# Patient Record
Sex: Male | Born: 1970 | Race: Black or African American | Hispanic: No | Marital: Married | Smoking: Never smoker
Health system: Southern US, Community
[De-identification: ages and names within clinical notes are randomized; demographics above are authoritative.]

## PROBLEM LIST (undated history)

## (undated) DIAGNOSIS — Z9989 Dependence on other enabling machines and devices: Secondary | ICD-10-CM

## (undated) DIAGNOSIS — G4730 Sleep apnea, unspecified: Secondary | ICD-10-CM

## (undated) DIAGNOSIS — G4733 Obstructive sleep apnea (adult) (pediatric): Secondary | ICD-10-CM

## (undated) DIAGNOSIS — K219 Gastro-esophageal reflux disease without esophagitis: Secondary | ICD-10-CM

## (undated) DIAGNOSIS — T7840XA Allergy, unspecified, initial encounter: Secondary | ICD-10-CM

## (undated) DIAGNOSIS — K259 Gastric ulcer, unspecified as acute or chronic, without hemorrhage or perforation: Secondary | ICD-10-CM

## (undated) DIAGNOSIS — F419 Anxiety disorder, unspecified: Secondary | ICD-10-CM

## (undated) HISTORY — DX: Dependence on other enabling machines and devices: Z99.89

## (undated) HISTORY — DX: Obstructive sleep apnea (adult) (pediatric): G47.33

## (undated) HISTORY — DX: Allergy, unspecified, initial encounter: T78.40XA

## (undated) HISTORY — DX: Gastric ulcer, unspecified as acute or chronic, without hemorrhage or perforation: K25.9

## (undated) HISTORY — PX: NASAL SINUS SURGERY: SHX719

## (undated) HISTORY — DX: Sleep apnea, unspecified: G47.30

## (undated) HISTORY — DX: Anxiety disorder, unspecified: F41.9

## (undated) HISTORY — DX: Gastro-esophageal reflux disease without esophagitis: K21.9

---

## 2004-12-04 ENCOUNTER — Emergency Department: Payer: Self-pay | Admitting: Emergency Medicine

## 2005-03-29 ENCOUNTER — Ambulatory Visit: Payer: Self-pay | Admitting: Family Medicine

## 2005-05-17 ENCOUNTER — Ambulatory Visit: Payer: Self-pay | Admitting: Family Medicine

## 2005-08-07 ENCOUNTER — Ambulatory Visit: Payer: Self-pay | Admitting: Family Medicine

## 2005-08-14 ENCOUNTER — Ambulatory Visit: Payer: Self-pay | Admitting: Family Medicine

## 2006-09-15 ENCOUNTER — Emergency Department: Payer: Self-pay | Admitting: Emergency Medicine

## 2006-10-02 ENCOUNTER — Ambulatory Visit: Payer: Self-pay | Admitting: Gastroenterology

## 2006-11-18 ENCOUNTER — Emergency Department: Payer: Self-pay | Admitting: Emergency Medicine

## 2007-10-31 ENCOUNTER — Emergency Department: Payer: Self-pay | Admitting: Emergency Medicine

## 2008-03-17 ENCOUNTER — Encounter: Payer: Self-pay | Admitting: Family Medicine

## 2008-05-17 ENCOUNTER — Encounter: Payer: Self-pay | Admitting: Family Medicine

## 2008-10-13 ENCOUNTER — Encounter: Payer: Self-pay | Admitting: Family Medicine

## 2009-12-03 ENCOUNTER — Emergency Department: Payer: Self-pay | Admitting: Emergency Medicine

## 2010-01-20 ENCOUNTER — Encounter: Payer: Self-pay | Admitting: Family Medicine

## 2010-07-25 DIAGNOSIS — K219 Gastro-esophageal reflux disease without esophagitis: Secondary | ICD-10-CM

## 2010-08-09 ENCOUNTER — Ambulatory Visit: Payer: Self-pay | Admitting: Family Medicine

## 2010-08-09 ENCOUNTER — Encounter: Payer: Self-pay | Admitting: Internal Medicine

## 2010-08-09 DIAGNOSIS — G4730 Sleep apnea, unspecified: Secondary | ICD-10-CM | POA: Insufficient documentation

## 2010-08-15 ENCOUNTER — Telehealth: Payer: Self-pay | Admitting: Family Medicine

## 2010-08-17 ENCOUNTER — Ambulatory Visit: Payer: Self-pay | Admitting: Family Medicine

## 2010-08-21 LAB — CONVERTED CEMR LAB
ALT: 30 units/L (ref 0–53)
AST: 24 units/L (ref 0–37)
Albumin: 3.9 g/dL (ref 3.5–5.2)
Alkaline Phosphatase: 45 units/L (ref 39–117)
CO2: 32 meq/L (ref 19–32)
Calcium: 9.7 mg/dL (ref 8.4–10.5)
Creatinine, Ser: 1.1 mg/dL (ref 0.4–1.5)
GFR calc non Af Amer: 95.98 mL/min (ref >60)
Glucose, Bld: 91 mg/dL (ref 70–99)
Total CHOL/HDL Ratio: 4

## 2010-08-28 ENCOUNTER — Encounter: Payer: Self-pay | Admitting: Family Medicine

## 2010-09-04 ENCOUNTER — Ambulatory Visit: Payer: Self-pay | Admitting: Family Medicine

## 2010-09-04 DIAGNOSIS — J45909 Unspecified asthma, uncomplicated: Secondary | ICD-10-CM | POA: Insufficient documentation

## 2010-11-07 ENCOUNTER — Ambulatory Visit: Admit: 2010-11-07 | Payer: Self-pay | Admitting: Internal Medicine

## 2010-11-26 LAB — CONVERTED CEMR LAB
ALT: 18 units/L
ALT: 29 units/L
AST: 22 units/L
BUN: 18 mg/dL
Cholesterol: 148 mg/dL
Creatinine, Ser: 1.07 mg/dL
Glucose, Bld: 90 mg/dL
HDL: 42 mg/dL
Hemoglobin: 15.6 g/dL
LDL Cholesterol: 97 mg/dL
RBC: 4.92 M/uL
RBC: 5.17 M/uL
Sodium: 137 meq/L
TSH: 2.68 microintl units/mL
WBC: 6 10*3/uL

## 2010-11-28 NOTE — Miscellaneous (Signed)
  Clinical Lists Changes  Observations: Added new observation of HCT: 46.3 % (01/20/2010 9:18) Added new observation of HGB: 15.6 g/dL (40/07/2724 3:66) Added new observation of RBC M/UL: 5.17 M/uL (01/20/2010 9:18) Added new observation of WBC COUNT: 6.0 10*3/microliter (01/20/2010 9:18) Added new observation of TSH: 2.68 microintl units/mL (01/20/2010 9:18) Added new observation of SGPT (ALT): 18 units/L (01/20/2010 9:18) Added new observation of SGOT (AST): 20 units/L (01/20/2010 9:18) Added new observation of CREATININE: 1.26 mg/dL (44/12/4740 5:95) Added new observation of BUN: 18 mg/dL (63/87/5643 3:29) Added new observation of BG RANDOM: 81 mg/dL (51/88/4166 0:63) Added new observation of K SERUM: 4.3 meq/L (01/20/2010 9:18) Added new observation of NA: 137 meq/L (01/20/2010 9:18) Added new observation of LDL: 97 mg/dL (01/60/1093 2:35) Added new observation of HDL: 42 mg/dL (57/32/2025 4:27) Added new observation of TRIGLYC TOT: 47 mg/dL (04/21/7627 3:15) Added new observation of CHOLESTEROL: 148 mg/dL (17/61/6073 7:10) Added new observation of HCT: 44.6 % (10/13/2008 9:18) Added new observation of HGB: 14.7 g/dL (62/69/4854 6:27) Added new observation of RBC M/UL: 4.92 M/uL (10/13/2008 9:18) Added new observation of WBC COUNT: 4.8 10*3/microliter (10/13/2008 9:18) Added new observation of TSH: 3.130 microintl units/mL (10/13/2008 9:18) Added new observation of PROTEIN, TOT: 7.5 g/dL (03/50/0938 1:82) Added new observation of SGPT (ALT): 29 units/L (10/13/2008 9:18) Added new observation of SGOT (AST): 22 units/L (10/13/2008 9:18) Added new observation of CREATININE: 1.07 mg/dL (99/37/1696 7:89) Added new observation of BUN: 18 mg/dL (38/07/1750 0:25) Added new observation of BG RANDOM: 90 mg/dL (85/27/7824 2:35) Added new observation of K SERUM: 4.3 meq/L (10/13/2008 9:18) Added new observation of NA: 137 meq/L (10/13/2008 9:18) Added new observation of TSH: 1.478 microintl  units/mL (03/17/2008 9:18) Added new observation of T3 UPTAKE S: 31 % (03/17/2008 9:18) Added new observation of T4, TOTAL: 7.0 mcg/dL (36/14/4315 4:00)

## 2010-11-28 NOTE — Progress Notes (Signed)
Summary: needs order for CPAP mask  Phone Note Call from Patient Call back at [phone]   Caller: Patient Call For: Crawford Givens MD Summary of Call: Pt needs order for new CPAP mask and hose.  He does not want to go to pulmonologist.  He says the settings on his machine are ok, he just needs the new equipment. Initial call taken by: Lowella Petties CMA,  August 15, 2010 11:56 AM  Follow-up for Phone Call        Hard copy written for replacement parts.  Follow-up by: Crawford Givens MD,  August 15, 2010 1:55 PM  Additional Follow-up for Phone Call Additional follow up Details #1::        Patient Advised. Prescription left at front desk.  Additional Follow-up by: Delilah Shan CMA [name]),  August 15, 2010 2:13 PM

## 2010-11-28 NOTE — Assessment & Plan Note (Signed)
Summary: COUGH,CONGESTION,ASTHMA/CLE   Vital Signs:  Patient profile:   40 year old male Height:      71 inches Weight:      220.25 pounds BMI:     30.83 O2 Sat:      95 % on Room air Temp:     98.3 degrees F oral Pulse rate:   80 / minute Pulse rhythm:   regular BP sitting:   112 / 74  (left arm) Cuff size:   large  Vitals Entered By: Delilah Shan CMA [name]) (September 04, 2010 2:19 PM)  O2 Flow:  Room air CC: Cough, congestion, asthma symptoms   History of Present Illness: Head congestion, unable to rest.  Started Saturday, increased over the weekend.  Using his SABA neb.  Out of the MDI.  No fevers.  Cough with sputum.  R ear congested.  Mult sick contacts.  Inc in wheeze.    Usually uses SABA 2x/month.   Able to speak in complete sentences.  O2 sat pre neb.   Allergies: No Known Drug Allergies  Past History:  Past Medical History: GERD (ICD-530.81), h/o esophageal erosions FAMILY HISTORY DIABETES 1ST DEGREE RELATIVE (ICD-V18.0) OSA Asthma  Review of Systems       See HPI.  Otherwise negative.    Physical Exam  General:  GEN: nad, alert and oriented HEENT: mucous membranes moist, L TM w/o erythema, R TM with erythema, nasal epithelium injected, OP with cobblestoning NECK: supple w/o LA CV: rrr. PULM: no inc wob but diffuse ronchi and exp wheeze, improved after neb ABD: soft, +bs EXT: no edema    Impression & Recommendations:  Problem # 1:  OTITIS MEDIA, ACUTE, RIGHT (ICD-382.9) Start zmax and follow up as needed.  See instructions. His updated medication list for this problem includes:    Zithromax 250 Mg Tabs (Azithromycin) .Marland Kitchen... 2 by mouth today and then 1 by mouth once daily for 4 days  Problem # 2:  ACUTE BRONCHITIS (ICD-466.0) Start pred and concurrent antibiotics.  Use SABA as needed and follow up as needed.  Okay for outpatient follow up.  He agrees with plan.  His updated medication list for this problem includes:    Zithromax 250 Mg Tabs  (Azithromycin) .Marland Kitchen... 2 by mouth today and then 1 by mouth once daily for 4 days    Proair Hfa 108 (90 Base) Mcg/act Aers (Albuterol sulfate) .Marland Kitchen... 2 puffs q4h as needed cough/wheeze  Complete Medication List: 1)  Nexium 40 Mg Cpdr (Esomeprazole magnesium) .... Take 1 tablet by mouth once a day 2)  Zithromax 250 Mg Tabs (Azithromycin) .... 2 by mouth today and then 1 by mouth once daily for 4 days 3)  Prednisone 20 Mg Tabs (Prednisone) .... 2 by mouth once daily for 5 days 4)  Proair Hfa 108 (90 Base) Mcg/act Aers (Albuterol sulfate) .... 2 puffs q4h as needed cough/wheeze  Patient Instructions: 1)  I would start the antibiotics today along with the prednisone.  Take the prednisone with food.  Use the inhaler every 4 hours as needed.  Follow up as need.  Take care.  Prescriptions: PROAIR HFA 108 (90 BASE) MCG/ACT AERS (ALBUTEROL SULFATE) 2 puffs q4h as needed cough/wheeze  #1 x 3   Entered and Authorized by:   Crawford Givens MD   Signed by:   Crawford Givens MD on 09/04/2010   Method used:   Electronically to        [id number]  [address]       [place], [place]  [address]       Ph: [phone]       Fax: [phone]   RxID:   [id number] PREDNISONE 20 MG TABS (PREDNISONE) 2 by mouth once daily for 5 days  #10 x 0   Entered and Authorized by:   Crawford Givens MD   Signed by:   Crawford Givens MD on 09/04/2010   Method used:   Electronically to        [id number] ([facility]       [address]       [place], [place]  [address]       Ph: [phone]       Fax: [phone]   RxID:   [id number] ZITHROMAX 250 MG TABS (AZITHROMYCIN) 2 by mouth today and then 1 by mouth once daily for 4 days  #6 x 0   Entered and Authorized by:   Crawford Givens MD   Signed by:   Crawford Givens MD on 09/04/2010   Method used:   Electronically to        [id number]       [address]       [place], [place]  [address]        Ph: [phone]       Fax: [phone]   RxID:   [id number]    Orders Added: 1)  Est. Patient Level III [46270]    Current Allergies (reviewed today): No known allergies

## 2010-11-28 NOTE — Assessment & Plan Note (Signed)
Summary: new patient/alc   Vital Signs:  Patient profile:   40 year old male Height:      71 inches Weight:      219 pounds BMI:     30.65 Temp:     98.3 degrees F oral Pulse rate:   72 / minute Pulse rhythm:   regular BP sitting:   112 / 82  (left arm) Cuff size:   large  Vitals Entered By: Selena Batten Dance CMA [name]) (August 09, 2010 2:54 PM) CC: New patient to establish care   History of Present Illness: GERD- h/o EGD done prev and erosions noted per patient.  Has been on Nexium for 7 years.  Now waking with stomach bloated and pain in back.  Similar to before he started the medicine.  Working to lose weight.  Head of bed not yet elevated.  Has OSA and is on CPAP.  Had sleep study done 2011 near [facility].  Records reviewed today after patient left office (we recieved them after the OV) patient with OSA that needs follow up.  See plan.   Allergies: No Known Drug Allergies  Past History:  Family History: Last updated: 08/09/2010 Family History Diabetes 1st degree relative, Parents, grandparents Family History Hypertension, parents, grandparents Family History of Prostate CA 1st degree relative <50, parents Family History of Stroke F 1st degree relative <60, parents, grandparents Family History of Hearth Disease, parents, grandparents F alive DM, HTN, prostate CA dx'd at 81 M alive, DM, HTN, CAD s/p CABG 4 brothers, 2 with DM, 3 with HTN also 1 sister, healthy  Social History: Last updated: 08/09/2010 Occupation: Art therapist' Production designer, theatre/television/film at [employer] Education:  12 Married 2002 Never Smoked Alcohol use-yes, rare Drug use-no Regular exercise-yes, gym enjoys basketball  Past Medical History: GERD (ICD-530.81), h/o esophageal erosions FAMILY HISTORY DIABETES 1ST DEGREE RELATIVE (ICD-V18.0) OSA  Past Surgical History: Nasal surgery 1990's  Family History: Family History Diabetes 1st degree relative, Parents, grandparents Family History Hypertension, parents,  grandparents Family History of Prostate CA 1st degree relative <50, parents Family History of Stroke F 1st degree relative <60, parents, grandparents Family History of Hearth Disease, parents, grandparents F alive DM, HTN, prostate CA dx'd at 45 M alive, DM, HTN, CAD s/p CABG 4 brothers, 2 with DM, 3 with HTN also 1 sister, healthy  Social History: Occupation: Art therapist' Production designer, theatre/television/film at [employer] Education:  12 Married 2002 Never Smoked Alcohol use-yes, rare Drug use-no Regular exercise-yes, gym enjoys basketball  Review of Systems       See HPI.  Otherwise negative.    Physical Exam  General:  GEN: nad, alert and oriented HEENT: mucous membranes moist NECK: supple w/o LA CV: rrr.  no murmur PULM: ctab, no inc wob ABD: soft, +bs EXT: no edema SKIN: no acute rash    Impression & Recommendations:  Problem # 1:  SLEEP APNEA (ICD-780.57) Recs reviewed.  Refer to pulm for adjustment on settings.  Already has CPAP at home.  Report in the midst of being scanned.  Orders: Pulmonary Referral (Pulmonary)  Problem # 2:  GERD (ICD-530.81) Refer to GI for consideration of repeat EGD considering the continuing symptoms.  No changes in meds today.  Await GI input.  His updated medication list for this problem includes:    Nexium 40 Mg Cpdr (Esomeprazole magnesium) .Marland Kitchen... Take 1 tablet by mouth once a day  Orders: Gastroenterology Referral (GI)  Problem # 3:  FAMILY HISTORY DIABETES 1ST DEGREE RELATIVE (ICD-V18.0) Return for fasting labs.  Problem # 4:  ISCHEMIC HEART DISEASE, FAMILY HX (ICD-V17.3) Return for fasting labs.    Complete Medication List: 1)  Nexium 40 Mg Cpdr (Esomeprazole magnesium) .... Take 1 tablet by mouth once a day   Patient Instructions: 1)  Glad to see you today.  2)  See Shirlee Limerick about your referral before your leave today.   3)  Come back for fasting labs.  cmet/lipid---v18.0 4)  See if you can get the name of the facility where you had your  sleep study done.  Let us know so we can get the records.  5)  Take care.  Glad to see you today.   Current Allergies (reviewed today): No known allergies

## 2010-11-28 NOTE — Letter (Signed)
Summary: New Patient letter  [facility]  [address] [place], [place] [address]   Phone: [phone]  Fax: [phone]       08/09/2010 MRN: [id number]  Garrett Hinton [address] [place], [place]  [address]  Dear Garrett Hinton,  Welcome to the Gastroenterology Division at [facility].    You are scheduled to see Dr.  Marina Goodell on 09-20-10 at 9:30am on the 3rd floor at [facility], [address]  We ask that you try to arrive at our office 15 minutes prior to your appointment time to allow for check-in.  We would like you to complete the enclosed self-administered evaluation form prior to your visit and bring it with you on the day of your appointment.  We will review it with you.  Also, please bring a complete list of all your medications or, if you prefer, bring the medication bottles and we will list them.  Please bring your insurance card so that we may make a copy of it.  If your insurance requires a referral to see a specialist, please bring your referral form from your primary care physician.  Co-payments are due at the time of your visit and may be paid by cash, check or credit card.     Your office visit will consist of a consult with your physician (includes a physical exam), any laboratory testing he/she may order, scheduling of any necessary diagnostic testing (e.g. x-ray, ultrasound, CT-scan), and scheduling of a procedure (e.g. Endoscopy, Colonoscopy) if required.  Please allow enough time on your schedule to allow for any/all of these possibilities.    If you cannot keep your appointment, please call [phone] to cancel or reschedule prior to your appointment date.  This allows [place] the opportunity to schedule an appointment for another patient in need of care.  If you do not cancel or reschedule by 5 p.m. the business day prior to your appointment date, you will be charged a $50.00 late cancellation/no-show fee.    Thank you  for choosing [facility] for your medical needs.  We appreciate the opportunity to care for you.  Please visit [place] at our website  to learn more about our practice.                     Sincerely,                                                             The Gastroenterology Division

## 2010-11-28 NOTE — Procedures (Signed)
Summary: Sleep Study,[facility]  Sleep Study,[facility]   Imported By: Beau Fanny 08/10/2010 09:55:50  _____________________________________________________________________  External Attachment:    Type:   Image     Comment:   External Document

## 2011-01-30 ENCOUNTER — Ambulatory Visit (INDEPENDENT_AMBULATORY_CARE_PROVIDER_SITE_OTHER): Payer: Self-pay | Admitting: Family Medicine

## 2011-01-30 ENCOUNTER — Encounter: Payer: Self-pay | Admitting: Family Medicine

## 2011-01-30 VITALS — BP 116/76 | HR 76 | Temp 98.3°F | Ht 71.0 in | Wt 217.0 lb

## 2011-01-30 DIAGNOSIS — J019 Acute sinusitis, unspecified: Secondary | ICD-10-CM

## 2011-01-30 MED ORDER — AMOXICILLIN 875 MG PO TABS: 875.0000 mg | ORAL_TABLET | Freq: Two times a day (BID) | ORAL | Status: AC

## 2011-01-30 MED ORDER — FLUTICASONE FUROATE 27.5 MCG/SPRAY NA SUSP: 2.0000 | Freq: Every day | NASAL | Status: DC

## 2011-01-30 NOTE — Assessment & Plan Note (Addendum)
Start veramyst and amoxil.  Supportive tx o/w.  Fu prn.  Samples of veramyst given to patient.  Okay for outpatient fu.  He declined work note.

## 2011-01-30 NOTE — Patient Instructions (Signed)
Drink plenty of fluids, take tylenol as needed, and star the amoxil today.  Use the veramyst 2 sprays in each nostril.  This should gradually improve.  Take care.  Let us know if you have other concerns.

## 2011-01-30 NOTE — Progress Notes (Signed)
duration of symptoms: 2 weeks and inc in sx over last few days rhinorrhea:yes congestion:yes ear pain:occ in R ear, feels "clogged" sore throat: no but "itchy" Cough: no Myalgias: yes other concerns: no help with otc allergy meds and mucinex/afrin. HA per patient. No known fevers.  Taking tylenol.  ROS: See HPI.  Otherwise negative.    Meds, vitals, and allergies reviewed.   GEN: nad, alert and oriented HEENT: mucous membranes moist, TM w/o erythema, nasal epithelium injected, OP with cobblestoning, max and frontal sinus ttp.  NECK: supple w/o LA CV: rrr. PULM: ctab, no inc wob ABD: soft, +bs EXT: no edema

## 2011-02-16 ENCOUNTER — Telehealth: Payer: Self-pay | Admitting: *Deleted

## 2011-02-16 MED ORDER — AZITHROMYCIN 250 MG PO TABS: ORAL_TABLET | ORAL | Status: DC

## 2011-02-16 NOTE — Telephone Encounter (Signed)
Pt was seen for sinusitis and given amox and veramyst.  He says the spray never worked, he finished amox last week.  He still has left side face and ear pain, cant fully hear out of ear- feels like he is under water.  [facility].  Should he have more antibiotic or follow up visit?

## 2011-02-16 NOTE — Telephone Encounter (Signed)
I sent in the abx (zithromax).  Have him use that an keep using nasal saline.  If he isn't starting to get better by the end of next week, let us know.  Thanks.

## 2011-02-16 NOTE — Telephone Encounter (Signed)
Patient notified

## 2011-02-22 ENCOUNTER — Telehealth: Payer: Self-pay | Admitting: *Deleted

## 2011-02-22 DIAGNOSIS — Z8249 Family history of ischemic heart disease and other diseases of the circulatory system: Secondary | ICD-10-CM

## 2011-02-22 DIAGNOSIS — Z209 Contact with and (suspected) exposure to unspecified communicable disease: Secondary | ICD-10-CM

## 2011-02-22 NOTE — Telephone Encounter (Signed)
Pt is coming in tomorrow for physical labs and wants to be checked for STD's.  He says his wife has had some problems with pelvic pain and he wants to make sure he doesn't have anything.

## 2011-02-23 ENCOUNTER — Other Ambulatory Visit (INDEPENDENT_AMBULATORY_CARE_PROVIDER_SITE_OTHER): Payer: BC Managed Care – PPO | Admitting: Family Medicine

## 2011-02-23 DIAGNOSIS — Z209 Contact with and (suspected) exposure to unspecified communicable disease: Secondary | ICD-10-CM

## 2011-02-23 DIAGNOSIS — Z8249 Family history of ischemic heart disease and other diseases of the circulatory system: Secondary | ICD-10-CM

## 2011-02-23 LAB — LIPID PANEL
Cholesterol: 156 mg/dL (ref 0–200)
HDL: 36.6 mg/dL — ABNORMAL LOW (ref >39.00)
LDL Cholesterol: 112 mg/dL — ABNORMAL HIGH (ref 0–99)
Triglycerides: 36 mg/dL (ref 0.0–149.0)
VLDL: 7.2 mg/dL (ref 0.0–40.0)

## 2011-02-23 LAB — GLUCOSE, RANDOM: Glucose, Bld: 93 mg/dL (ref 70–99)

## 2011-02-27 ENCOUNTER — Encounter: Payer: BC Managed Care – PPO | Admitting: Family Medicine

## 2011-03-01 ENCOUNTER — Encounter: Payer: Self-pay | Admitting: Family Medicine

## 2011-03-01 ENCOUNTER — Ambulatory Visit (INDEPENDENT_AMBULATORY_CARE_PROVIDER_SITE_OTHER): Payer: BC Managed Care – PPO | Admitting: Family Medicine

## 2011-03-01 DIAGNOSIS — Z0000 Encounter for general adult medical examination without abnormal findings: Secondary | ICD-10-CM

## 2011-03-01 DIAGNOSIS — L910 Hypertrophic scar: Secondary | ICD-10-CM | POA: Insufficient documentation

## 2011-03-01 DIAGNOSIS — G4730 Sleep apnea, unspecified: Secondary | ICD-10-CM

## 2011-03-01 DIAGNOSIS — G4733 Obstructive sleep apnea (adult) (pediatric): Secondary | ICD-10-CM

## 2011-03-01 NOTE — Assessment & Plan Note (Signed)
Wrote rx for DME/mask/hose/humidifier.  He'll call insurance about where to take it.  Will call back as needed, if air leaks persist or if sleep continues to be disrupted.  I would replace the old hardware first and then proceed from there. D/w pt about weight.

## 2011-03-01 NOTE — Assessment & Plan Note (Signed)
Refer

## 2011-03-01 NOTE — Progress Notes (Signed)
CPE- See plan.  Routine anticipatory guidance given to patient.  See health maintenance.  Fatigue-  No FCNAVD. No bruising, bleeding.   OSA and having trouble with air leaks.  Wife notices noisy breathing at night.  Fatigue in AM.  HA in AM.   Keloid on L shoulder- asking about referral.   PMH and SH reviewed  Meds, vitals, and allergies reviewed.   ROS: See HPI.  Otherwise negative.    GEN: nad, alert and oriented HEENT: mucous membranes moist NECK: supple w/o LA CV: rrr. PULM: ctab, no inc wob ABD: soft, +bs EXT: no edema SKIN: no acute rash but keloid noted on L shoulder

## 2011-03-01 NOTE — Assessment & Plan Note (Signed)
Father with prostate cancer at 17.  Aunt with colon cancer, late in life.   Needs screening at 50 for both.  Tdap due 2016. Flu shot d/w pt.  Encouraged for fall vaccine.  D/w pt about labs, diet and exercise.

## 2011-03-01 NOTE — Patient Instructions (Addendum)
See Shirlee Limerick about your referral before your leave today. Keep exercising.  Add up your daily calorie intake on a calorie count website and then cut your total by 5%. Call your insurance about the sleep apnea materials. Take care.

## 2011-05-23 ENCOUNTER — Encounter: Payer: Self-pay | Admitting: Family Medicine

## 2011-07-11 ENCOUNTER — Other Ambulatory Visit: Payer: Self-pay | Admitting: Family Medicine

## 2011-09-18 ENCOUNTER — Encounter: Payer: Self-pay | Admitting: Family Medicine

## 2011-09-18 ENCOUNTER — Ambulatory Visit (INDEPENDENT_AMBULATORY_CARE_PROVIDER_SITE_OTHER): Payer: BC Managed Care – PPO | Admitting: Family Medicine

## 2011-09-18 VITALS — BP 112/70 | HR 91 | Temp 98.5°F | Wt 229.1 lb

## 2011-09-18 DIAGNOSIS — J309 Allergic rhinitis, unspecified: Secondary | ICD-10-CM | POA: Insufficient documentation

## 2011-09-18 DIAGNOSIS — Z23 Encounter for immunization: Secondary | ICD-10-CM

## 2011-09-18 MED ORDER — MOMETASONE FUROATE 50 MCG/ACT NA SUSP: NASAL | Status: DC

## 2011-09-18 MED ORDER — BUDESONIDE-FORMOTEROL FUMARATE 80-4.5 MCG/ACT IN AERO: 1.0000 | INHALATION_SPRAY | Freq: Two times a day (BID) | RESPIRATORY_TRACT | Status: DC

## 2011-09-18 NOTE — Assessment & Plan Note (Signed)
Continue nasonex, but inc to 2 sprays per nostril and f/u prn.  He agrees.  Flu shot done today.

## 2011-09-18 NOTE — Patient Instructions (Signed)
Use the nasonex- 2 sprays in each nostril once a day.  See if that helps.  Keep using nasal saline at night.  Take care.

## 2011-09-18 NOTE — Progress Notes (Deleted)
  Subjective:    Patient ID: Garrett Hinton, male    DOB: [birth year], 40 y.o.   MRN: [id number]  HPI    Review of Systems     Objective:   Physical Exam        Assessment & Plan:

## 2011-09-18 NOTE — Progress Notes (Signed)
B ear popping, post nasal gtt for 2-3 weeks. No fevers.  Scratchy throat.  + facial pressure.  No cough.  Off symbicort in meantime.  No wheeze recently.  Still using nasonex but needs more. Was using 1 spray in each nostril.  Using nasal saline at night.   Meds, vitals, and allergies reviewed.   ROS: See HPI.  Otherwise, noncontributory.  nad ncat Tm w/o erythema but poor movement on valsalva Nasal tissue stuffy with clear discharge OP with cobblestoning Neck supple, no LA rrr ctab

## 2011-10-15 ENCOUNTER — Other Ambulatory Visit: Payer: Self-pay | Admitting: Family Medicine

## 2012-02-22 ENCOUNTER — Encounter: Payer: Self-pay | Admitting: Family Medicine

## 2012-02-22 ENCOUNTER — Ambulatory Visit (INDEPENDENT_AMBULATORY_CARE_PROVIDER_SITE_OTHER): Payer: BC Managed Care – PPO | Admitting: Family Medicine

## 2012-02-22 VITALS — BP 120/84 | HR 82 | Temp 98.0°F | Ht 71.0 in | Wt 218.1 lb

## 2012-02-22 DIAGNOSIS — J309 Allergic rhinitis, unspecified: Secondary | ICD-10-CM

## 2012-02-22 DIAGNOSIS — J0100 Acute maxillary sinusitis, unspecified: Secondary | ICD-10-CM

## 2012-02-22 DIAGNOSIS — H1010 Acute atopic conjunctivitis, unspecified eye: Secondary | ICD-10-CM

## 2012-02-22 MED ORDER — AMOXICILLIN-POT CLAVULANATE 875-125 MG PO TABS: 1.0000 | ORAL_TABLET | Freq: Two times a day (BID) | ORAL | Status: AC

## 2012-02-22 MED ORDER — FLUTICASONE PROPIONATE 50 MCG/ACT NA SUSP: 2.0000 | Freq: Every day | NASAL | Status: DC

## 2012-02-22 NOTE — Patient Instructions (Addendum)
Change to try some Allegra   Genteal Eye Drops: get the severe allergy drops or gel

## 2012-02-22 NOTE — Progress Notes (Signed)
  Patient Name: Garrett Hinton Date of Birth: November 04, 1970 Age: 41 y.o. Medical Record Number: [id number] Gender: male Date of Encounter: 02/22/2012  History of Present Illness:  Garrett Hinton is a 41 y.o. very pleasant male patient who presents with the following:  Sinus pressure --- Left pressure behind left eyes. Cannot breathe for a month. Itchy throat. Ears and eyes are hurting.  Pain behind L > R sinuses, mostly in the maxillary region Bad nasal congestion for a week, stuffy ears, itchy eyes  Taking Zyrtec, Mucinex D, has taken some nasonex, but cannot afford eye drops.  Past Medical History, Surgical History, Social History, Family History, Problem List, Medications, and Allergies have been reviewed and updated if relevant.  Review of Systems: ROS: GEN: Acute illness details above GI: Tolerating PO intake GU: maintaining adequate hydration and urination Pulm: No SOB Interactive and getting along well at home.  Otherwise, ROS is as per the HPI.   Physical Examination: Filed Vitals:   02/22/12 1124  BP: 120/84  Pulse: 82  Temp: 98 F (36.7 C)  TempSrc: Oral  Height: 5\' 11"  (1.803 m)  Weight: 218 lb 1.9 oz (98.939 kg)  SpO2: 98%    Body mass index is 30.42 kg/(m^2).   Gen: WDWN, NAD; alert,appropriate and cooperative throughout exam  HEENT: Normocephalic and atraumatic. Throat clear, w/o exudate, no LAD, R and L TM full with mild fluid. rhinnorhea.  Left frontal and maxillary sinuses: Tender (most tender) Right frontal and maxillary sinuses: Tender  Neck: No ant or post LAD CV: RRR, No M/G/R Pulm: Breathing comfortably in no resp distress. no w/c/r Abd: S,NT,ND,+BS Extr: no c/c/e Psych: full affect, pleasant   Assessment and Plan:  1. Maxillary sinusitis, acute   2. Allergic conjunctivitis and rhinitis    Destabilized AR and AC, leading to sinusitis  Flonase, allegra, genteal  Orders Today: No orders of the defined types were placed in  this encounter.    Medications Today: Meds ordered this encounter  Medications  . fluticasone (FLONASE) 50 MCG/ACT nasal spray    Sig: Place 2 sprays into the nose daily.    Dispense:  16 g    Refill:  12  . amoxicillin-clavulanate (AUGMENTIN) 875-125 MG per tablet    Sig: Take 1 tablet by mouth 2 (two) times daily.    Dispense:  20 tablet    Refill:  0

## 2012-03-10 ENCOUNTER — Telehealth: Payer: Self-pay | Admitting: Family Medicine

## 2012-03-10 NOTE — Telephone Encounter (Signed)
Caller: Garrett Hinton/Patient; PCP: Crawford Givens Clelia Croft); CB#: [phone]; ; ; Call regarding Cough/Congestion;  Pt was seen on 02/22/12 for a sinus infection - given script for Flonase and Augmentin x 10 days. He is still have severe sinus congestion with sinus h/a/frontal lobe /pressure behind his eyes. He is afeb. No improvement on abx. Rn advised f/u appt for re-evaluation/pt refused. He states no money. He also hasn't been seeing the allergest since last year d/t finacial pressures. Dr. Alesia Richards. Pt is asking office to call in a refill of his Albuterol (RN saw order in April but no refills noted?) and if office could change Flonase back to [employer] and call in Symbacort inh QD.  [facility] OFFICE PLEASE CALL PT AND ADVISE. (he only has enough of his Albuterol to last today).  Request sent via [facility] and note sent to MD.   Per Lesli Albee RN 03/10/12 1057    Caller: Garrett Hinton/Patient; PCP: Crawford Givens Clelia Croft); CB#: [phone]; ; ; Call regarding Medication Question; states requested office to consider calling in Albuterol, and changing Flonase back to Nasonex, as he feels the flonase does nothing to iimprove his symptoms, and also to please call in a refill for symbacort inhaler BID.  Declines new triage or appt at this time.  INFO TO OFFICE FOR PROVIDER REVIEW/RX/CALLBACK.  [facility].  MAY REACH PATIENT [phone].

## 2012-03-10 NOTE — Telephone Encounter (Signed)
aller: Garrett Hinton/Patient; PCP: Tabori, Katherine E.; CB#: [phone]; ; ; Call regarding Cuts, Scrapes, Abrasions;  Pt has poison ivy across her jaw /around her mouth and up to her eye. Onset yesterday. Rn traiged and advised appt. No appts available at this point/RN called the back line and then the [place] location. Pt works until 2:30 today. Pt given an appt at the [place] location  for 3:30 today wtih NP O'Sullivan. RN called the number left by the pt to contact 3x and left vm with information.  

## 2012-03-11 ENCOUNTER — Other Ambulatory Visit: Payer: Self-pay | Admitting: *Deleted

## 2012-03-11 MED ORDER — MOMETASONE FUROATE 50 MCG/ACT NA SUSP: 2.0000 | Freq: Every day | NASAL | Status: DC

## 2012-03-11 MED ORDER — BUDESONIDE-FORMOTEROL FUMARATE 80-4.5 MCG/ACT IN AERO: 2.0000 | INHALATION_SPRAY | Freq: Two times a day (BID) | RESPIRATORY_TRACT | Status: AC

## 2012-03-11 MED ORDER — ALBUTEROL SULFATE HFA 108 (90 BASE) MCG/ACT IN AERS: 2.0000 | INHALATION_SPRAY | RESPIRATORY_TRACT | Status: AC | PRN

## 2012-03-11 NOTE — Telephone Encounter (Signed)
Patient advised.

## 2012-03-11 NOTE — Telephone Encounter (Signed)
Ok to refill. plz notify sent all in.  I've changed flonase to nasonex. If asthma not controlled on these meds will need to come in for further eval/changes. Will route to PCP as fyi.

## 2012-03-11 NOTE — Telephone Encounter (Signed)
Please see note from CAN.  Is it okay to refill these Rx's.  Patient says he needs them now.

## 2012-03-17 ENCOUNTER — Encounter: Payer: Self-pay | Admitting: Family Medicine

## 2012-04-21 ENCOUNTER — Telehealth: Payer: Self-pay | Admitting: Family Medicine

## 2012-04-21 NOTE — Telephone Encounter (Signed)
Caller: Mike/Patient; PCP: Sunnie Nielsen); CB#: [phone]; ; ; Call regarding Shoulder Blade Pain;  Pt has an appt scheduled tomorrow at 12:15. he was transferred from the office for a triage. Pt has left shoulder pain at the back that responds to Ibuprofen. Pt is taking 400mg  BID.  Pt did not sustanin an injury. Pt is also having a "stinging sensation" on the right side of the skin around  his lower ribs - if his wife puts her cold hands on it - it feels burning. This is constant. Pt has had it x 2 days. It has stayed the same in severity. Pt feels tired and fatigued.  Pt states if he twists he knows it's there. Pt does take Nexium if he had discomfort like that and it will relieve the discomfort but this isn't. RN advised pt may keep appt tomorrow but call back if sx change or worsen.

## 2012-04-21 NOTE — Telephone Encounter (Signed)
Agreed, will see patient tomorrow.

## 2012-04-22 ENCOUNTER — Encounter: Payer: Self-pay | Admitting: Family Medicine

## 2012-04-22 ENCOUNTER — Ambulatory Visit (INDEPENDENT_AMBULATORY_CARE_PROVIDER_SITE_OTHER): Payer: BC Managed Care – PPO | Admitting: Family Medicine

## 2012-04-22 VITALS — BP 122/80 | HR 74 | Temp 98.6°F | Wt 217.0 lb

## 2012-04-22 DIAGNOSIS — B029 Zoster without complications: Secondary | ICD-10-CM | POA: Insufficient documentation

## 2012-04-22 MED ORDER — VALACYCLOVIR HCL 1 G PO TABS: 1000.0000 mg | ORAL_TABLET | Freq: Three times a day (TID) | ORAL | Status: DC

## 2012-04-22 NOTE — Assessment & Plan Note (Signed)
Typical distribution.  Start valtrex.  Minimal pain.  Will address if it is more of a problem.  Path/phys d/w pt.

## 2012-04-22 NOTE — Patient Instructions (Signed)
Start the valtex today and if you have a lot of pain let me know.

## 2012-04-22 NOTE — Progress Notes (Signed)
Saturday night he had some back pain, near his L shoulder blade and then R > L abdomen pain. The skin on the R side of abd was sensitive to light touch and the area felt hot.  No known rash.  No FCNAVD.  Burning type pain.  The affected area can itch.  Taking ibuprofen with some relief.  Recently with occ HA.  He attributed them to stress ("home, work, everything").  He's using CPAP for OSA with some affect.  Fatigue is noted over the last few days with the symptoms listed above.    Meds, vitals, and allergies reviewed.   ROS: See HPI.  Otherwise, noncontributory.  nad R hemiabdomen with dermatomal change in skin sensation and faint erythema on posterior portion of dermatome.  abd exam wnl o/w

## 2012-04-23 ENCOUNTER — Other Ambulatory Visit: Payer: Self-pay | Admitting: Family Medicine

## 2012-04-24 ENCOUNTER — Telehealth: Payer: Self-pay

## 2012-04-24 NOTE — Telephone Encounter (Signed)
Pt request refill on Nexium. Sherese at [facility] said Nexium rx is ready for pick up. Pt notified by phone. Pt wants to know if any restrictions with shingles and exposure to other people especially children or elderly family members.Please advise.

## 2012-04-24 NOTE — Telephone Encounter (Signed)
Patient advised.

## 2012-04-24 NOTE — Telephone Encounter (Signed)
He shouldn't be around pregnant women or unvaccinated children/people who haven't have chickenpox for the next few days.

## 2012-05-14 ENCOUNTER — Telehealth: Payer: Self-pay

## 2012-05-14 NOTE — Telephone Encounter (Signed)
Pt cannot remember date but within the year pt and pts wife had testing for all STD testing. Pt said he and wife got results back as negative. Pts wife was seen recently by GYN and told had herpes. Pt wonders if 04/22/12 visit diagnosed as shingles might be something else. Please advise. [facility] [address] [facility].

## 2012-05-14 NOTE — Telephone Encounter (Signed)
I think it was unlikely to be herpes. I think it was shingles.  We could draw HSV labs for testing, but if he's ever had a cold sore his test would automatically be positive (so the test might not be useful in this case).

## 2012-05-15 NOTE — Telephone Encounter (Signed)
Patient notified as instructed by telephone. Pt said when had previous testing for all STDs he thought herpes test would have been included. Was herpes test done then? Pt said wife tested last month at GYN and tested positive for herpes. Please advise.

## 2012-05-15 NOTE — Telephone Encounter (Signed)
Patient says he has never had any cold sores, etc and that he and his wife thought everything was going to be tested.  He is a bit disgruntled that this test was not done.

## 2012-05-15 NOTE — Telephone Encounter (Signed)
STD testing was done but not HSV.  It is often not done routinely as any person with a h/o a cold sore would be positive, even if they didn't have a STD.

## 2012-05-15 NOTE — Telephone Encounter (Signed)
If he wants me to check him, he needs an OV so we can discuss the details.

## 2012-05-15 NOTE — Telephone Encounter (Signed)
Patient advised.

## 2012-07-08 ENCOUNTER — Telehealth: Payer: Self-pay

## 2012-07-08 NOTE — Telephone Encounter (Signed)
Prior auth needed for Nexium.called [phone] spoke with Harriett Sine; Nexium 40 mg approved 06/17/12 thru 07/08/13 over the phone. Case ID [id number]. Spoke Denise with [facility] [employer]; rx went thru and Angelique Blonder will notify pt. Approval letter to follow.

## 2012-07-15 ENCOUNTER — Ambulatory Visit (INDEPENDENT_AMBULATORY_CARE_PROVIDER_SITE_OTHER): Payer: BC Managed Care – PPO | Admitting: Family Medicine

## 2012-07-15 ENCOUNTER — Encounter: Payer: Self-pay | Admitting: Family Medicine

## 2012-07-15 VITALS — BP 122/78 | HR 79 | Temp 98.3°F | Wt 226.0 lb

## 2012-07-15 DIAGNOSIS — M7650 Patellar tendinitis, unspecified knee: Secondary | ICD-10-CM

## 2012-07-15 DIAGNOSIS — J329 Chronic sinusitis, unspecified: Secondary | ICD-10-CM

## 2012-07-15 MED ORDER — ESOMEPRAZOLE MAGNESIUM 40 MG PO CPDR: 40.0000 mg | DELAYED_RELEASE_CAPSULE | Freq: Every day | ORAL | Status: DC

## 2012-07-15 MED ORDER — AMOXICILLIN-POT CLAVULANATE 875-125 MG PO TABS: 1.0000 | ORAL_TABLET | Freq: Two times a day (BID) | ORAL | Status: DC

## 2012-07-15 NOTE — Patient Instructions (Addendum)
Take ibuprofen with food and use the exercises for your knee.  Get a jumper's knee brace.  Start the augmentin and use nasal saline.  Take care.

## 2012-07-15 NOTE — Progress Notes (Signed)
Was fine over the summer, but then got sick since going back to school.  Facial pain, dizzy sensation, stuffy, ear pain.  L ear will pop now, but the R ear won't.  Minimal ST recently, predates all the other sx.  No fevers.  He didn't know if a trigger at school was causing this.  Using nasal spray and inhalers.  No wheeze. Rare SABA use.    L knee pain.  Anterior pain.  No injury.  Pain started today.  Doesn't have to wear steel toe boots at work.  L knee with popping on ROM, but doesn't lock.  L knee will be stiff occ.    No R sided knee pain now.    Meds, vitals, and allergies reviewed.   ROS: See HPI.  Otherwise, noncontributory.  GEN: nad, alert and oriented HEENT: mucous membranes moist, tm w/o erythema, nasal exam w/o erythema, clear discharge noted,  OP with cobblestoning, max sinuses ttp NECK: supple w/o LA CV: rrr.   PULM: ctab, no inc wob EXT: no edema SKIN: no acute rash R knee with normal ROM and ACL/MCM/LCL feel solid but pain on the quad ligament, on palpation.  Not puffy, no erythema, no bruising.  Pain on quad ligament improved with circumferential pressure applied to the joint inferior to the patella (to approximate a Jumpers knee brace)

## 2012-07-16 DIAGNOSIS — M7650 Patellar tendinitis, unspecified knee: Secondary | ICD-10-CM | POA: Insufficient documentation

## 2012-07-16 DIAGNOSIS — J329 Chronic sinusitis, unspecified: Secondary | ICD-10-CM | POA: Insufficient documentation

## 2012-07-16 NOTE — Assessment & Plan Note (Signed)
Start augmentin, continue nasal steroids and use more nasal saline.  F/u prn.  He agrees.  If he continues to have trouble seasonally, we may need to get him back to the allergy clinic.

## 2012-07-16 NOTE — Assessment & Plan Note (Signed)
Instructions given re: stretching and strengthening.  Handout given.  Use ibuprofen with GI caution, get OTC Jumpers knee brace and f/u prn.

## 2012-07-17 ENCOUNTER — Telehealth: Payer: Self-pay | Admitting: Family Medicine

## 2012-07-17 DIAGNOSIS — Z209 Contact with and (suspected) exposure to unspecified communicable disease: Secondary | ICD-10-CM

## 2012-07-17 NOTE — Telephone Encounter (Signed)
Caller: Beverly/Patient; Patient Name: Garrett Hinton; PCP: Crawford Givens Clelia Croft) [facility]); Best Callback Phone Number: [phone] Pt was seen earlier this week in office and forgot to mention to the doctor that he would like blood testing for Herpes because his wife was diagnosed with Genital Herpes.  He denies any lesions or a outbreak. Emergent s/s of Herpes diagnosed or suspected protocol r/o. Pt to see provider within 24hrs. Pt would like a lab appointment.

## 2012-07-18 NOTE — Telephone Encounter (Signed)
Labs ordered for lab visit.

## 2012-07-18 NOTE — Telephone Encounter (Signed)
Lab appt scheduled.

## 2012-07-21 ENCOUNTER — Other Ambulatory Visit (INDEPENDENT_AMBULATORY_CARE_PROVIDER_SITE_OTHER): Payer: BC Managed Care – PPO

## 2012-07-21 ENCOUNTER — Telehealth: Payer: Self-pay

## 2012-07-21 DIAGNOSIS — Z209 Contact with and (suspected) exposure to unspecified communicable disease: Secondary | ICD-10-CM

## 2012-07-21 MED ORDER — TRAMADOL HCL 50 MG PO TABS: 50.0000 mg | ORAL_TABLET | Freq: Three times a day (TID) | ORAL | Status: DC | PRN

## 2012-07-21 NOTE — Telephone Encounter (Signed)
I would take up to 600mg  of ibuprofen 3 times a day with food and give the brace a few more days.  If still not improved, then take tramadol for pain.  Sedation caution.  rx sent.

## 2012-07-21 NOTE — Telephone Encounter (Signed)
Pt taking Ibuprofen 400 mg for knee pain; pain relief 1 hr after taking med then pain returns.Pt using knee brace. Pt request [facility] med [facility].Please advise.

## 2012-07-21 NOTE — Telephone Encounter (Signed)
Patient advised.

## 2012-07-22 LAB — HSV(HERPES SIMPLEX VRS) I + II AB-IGM: Herpes Simplex Vrs I&II-IgM Ab (EIA): 1.61 INDEX — ABNORMAL HIGH

## 2012-07-22 LAB — HSV(HERPES SIMPLEX VRS) I + II AB-IGG
HSV 1 Glycoprotein G Ab, IgG: <0.1 IV
HSV 2 Glycoprotein G Ab, IgG: 11.27 IV — ABNORMAL HIGH

## 2012-10-02 ENCOUNTER — Ambulatory Visit (INDEPENDENT_AMBULATORY_CARE_PROVIDER_SITE_OTHER): Payer: BC Managed Care – PPO | Admitting: Family Medicine

## 2012-10-02 ENCOUNTER — Encounter: Payer: Self-pay | Admitting: Family Medicine

## 2012-10-02 VITALS — BP 122/80 | HR 80 | Temp 98.3°F | Wt 231.8 lb

## 2012-10-02 DIAGNOSIS — K219 Gastro-esophageal reflux disease without esophagitis: Secondary | ICD-10-CM

## 2012-10-02 MED ORDER — FLUTICASONE PROPIONATE 50 MCG/ACT NA SUSP: NASAL | Status: AC

## 2012-10-02 NOTE — Patient Instructions (Addendum)
See Shirlee Limerick about your referral before you leave today.   Use the flonase if you can't get the nasonex.  I would get a flu shot each fall, when you are feeling better.

## 2012-10-02 NOTE — Progress Notes (Signed)
Last month of symptoms.  More nosebleeds, HA, fatigued.  H/o sig nasal stuffiness.  He had some nasal sores noted.  He used peroxide on a qtip locally in the nostril. No fevers.  Using CPAP but he needs a new mask.  Had not used nasonex in last 3 weeks. Getting on nasonex had helped prev, with less post nasal gtt.  Using nasal saline.  Flonase didn't work as well as nasonex per patient but he had only used low dose of flonase.   H/o GERD and EGD prev, now with less heartburn but persistent bloating of abd. He had to cancel prev GI referral and needs re-referral.   Meds, vitals, and allergies reviewed.   ROS: See HPI.  Otherwise, noncontributory.  GEN: nad, alert and oriented HEENT: mucous membranes moist, tm w/o erythema, nasal exam w/o erythema but stuffy, clear discharge noted,  OP with cobblestoning NECK: supple w/o LA CV: rrr.   PULM: ctab, no inc wob EXT: no edema SKIN: no acute rash

## 2012-10-03 ENCOUNTER — Encounter: Payer: Self-pay | Admitting: Internal Medicine

## 2012-10-03 NOTE — Assessment & Plan Note (Signed)
Likely exacerbated by being off nasal steroid with inc in nasal irritation.  Would start back on nasonex, if he can't get it then use flonase 2 sprays per nostril per day.  No abscess or nasal cellulitis noted.  F/u prn.  No need for abx based on exam.  F/u prn.

## 2012-10-03 NOTE — Assessment & Plan Note (Signed)
Refer to GI 

## 2012-10-14 ENCOUNTER — Telehealth: Payer: Self-pay | Admitting: Family Medicine

## 2012-10-14 NOTE — Telephone Encounter (Signed)
Patient says he doesn't have the money right now to come for an appt and get medication so he will have to try something different.

## 2012-10-14 NOTE — Telephone Encounter (Signed)
Patient Information:  Caller Name: Zakiah  Phone: [phone]  Patient: Garrett Hinton, Garrett Hinton  Gender: Male  DOB: 06/04/1971  Age: 41 Years  PCP: Crawford Givens Clelia Croft) [facility])  Office Follow Up:  Does the office need to follow up with this patient?: Yes  Instructions For The Office: patient wants antibiotic called in krs/can  RN Note:  Has worsened since being seen in office 10/02/12.  Afebrile.  States has had increased drainage, congestion, and stuffiness with tenderness of cheekbones and forehead.  Per protocol, emergent symptoms denied; advised appt within 24 hours.  Appt offered 10/15/12 but declined; wants Rx for antibiotic called in.  Info to office for provider review/Rx/callback.  [facility]  May reach patient at [phone].  krs/can  Symptoms  Reason For Call & Symptoms: sinus congestion, pain, and greenish nasal drainage  Reviewed Health History In EMR: Yes  Reviewed Medications In EMR: Yes  Reviewed Allergies In EMR: Yes  Reviewed Surgeries / Procedures: Yes  Date of Onset of Symptoms: 09/25/2012  Guideline(s) Used:  Sinus Pain and Congestion  Disposition Per Guideline:   See Today or Tomorrow in Office  Reason For Disposition Reached:   Lots of coughing  Advice Given:  N/A  Patient Refused Recommendation:  Patient Requests Prescription  Wants Rx for antibiotic krs/can

## 2012-10-14 NOTE — Telephone Encounter (Signed)
We need to see him and discuss this.  I don't know for certain he needs an abx.  Thanks.

## 2012-10-15 NOTE — Telephone Encounter (Signed)
Thanks

## 2012-10-15 NOTE — Telephone Encounter (Signed)
All we can offer is OV and have him talk with the front office about the billing.  I don't think it is reasonable to put him on abx blindly.

## 2012-10-15 NOTE — Telephone Encounter (Signed)
Patient notified as instructed by telephone. Was advised by patient that he went to the pharmacy and talked with the pharmacist and got some OTC medications. Patient will call back if he feels that he needs to come in.

## 2012-10-30 ENCOUNTER — Encounter: Payer: Self-pay | Admitting: Internal Medicine

## 2012-11-04 ENCOUNTER — Encounter: Payer: Self-pay | Admitting: Internal Medicine

## 2012-11-04 ENCOUNTER — Ambulatory Visit (INDEPENDENT_AMBULATORY_CARE_PROVIDER_SITE_OTHER): Payer: BC Managed Care – PPO | Admitting: Internal Medicine

## 2012-11-04 VITALS — BP 130/84 | HR 78 | Ht 71.0 in | Wt 232.4 lb

## 2012-11-04 DIAGNOSIS — R194 Change in bowel habit: Secondary | ICD-10-CM

## 2012-11-04 DIAGNOSIS — K219 Gastro-esophageal reflux disease without esophagitis: Secondary | ICD-10-CM

## 2012-11-04 DIAGNOSIS — R198 Other specified symptoms and signs involving the digestive system and abdomen: Secondary | ICD-10-CM

## 2012-11-04 DIAGNOSIS — R1013 Epigastric pain: Secondary | ICD-10-CM

## 2012-11-04 MED ORDER — PANTOPRAZOLE SODIUM 40 MG PO TBEC: 40.0000 mg | DELAYED_RELEASE_TABLET | Freq: Every day | ORAL | Status: DC

## 2012-11-04 MED ORDER — PEG-KCL-NACL-NASULF-NA ASC-C 100 G PO SOLR: 1.0000 | Freq: Once | ORAL | Status: DC

## 2012-11-04 NOTE — Patient Instructions (Addendum)
You have been scheduled for a colonoscopy/Endoscopy with propofol. Please follow written instructions given to you at your visit today.  Please pick up your prep kit at the pharmacy within the next 1-3 days. If you use inhalers (even only as needed) or a CPAP machine, please bring them with you on the day of your procedure.  We have sent the following medications to your pharmacy for you to pick up at your convenience: protonix, take 1 capsul daily, Moviprep; you were given instructions today at your visit.

## 2012-11-05 ENCOUNTER — Encounter: Payer: Self-pay | Admitting: Internal Medicine

## 2012-11-05 NOTE — Progress Notes (Signed)
Patient ID: Garrett Hinton, male   DOB: [birth year], 42 y.o.   MRN: [id number]  SUBJECTIVE: HPI Garrett Hinton is a 42 year old male with a past medical history of asthma, OSA, PUD, and GERD who seen in consultation at the request of Dr. Para March for evaluation of epigastric pain and reflux. The patient is alone today. The patient reports over the last 4-6 months, and possibly longer epigastric abdominal pain. This was most concentrated in the epigastrium just left of midline. It tended to radiate occasionally to the back. It has improved some with the use of Nexium which he was taking daily. Prior to Nexium he was having significant abdominal bloating, but this has improved somewhat. He is currently out of Nexium. He still reports frequent belching and some mild early satiety. He denies dysphagia or diet dysphagia. He notes some nausea but no vomiting. He does report occasional black stools which seem to come and go. He does endorse nosebleeds. He also reports a change in bowel habits with thinner or more narrow stools. There are times when his stools are normal caliber. He denies rectal bleeding. He does report some constipation but denies diarrhea.  He reports a history of prior upper endoscopy where he was told he had a "inflamed lining" and would need a repeat exam in about a year. He has never had a colonoscopy or flexible sigmoidoscopy.  Review of Systems  As per history of present illness, otherwise negative   Past Medical History  Diagnosis Date  . GERD (gastroesophageal reflux disease)   . Asthma   . OSA on CPAP   . Allergy   . Anxiety   . Sleep apnea   . Gastric ulcer     Current Outpatient Prescriptions  Medication Sig Dispense Refill  . albuterol (PROAIR HFA) 108 (90 BASE) MCG/ACT inhaler Inhale 2 puffs into the lungs every 4 (four) hours as needed.  1 Inhaler  11  . budesonide-formoterol (SYMBICORT) 80-4.5 MCG/ACT inhaler Inhale 2 puffs into the lungs 2 (two) times daily.   1 Inhaler  11  . fluticasone (FLONASE) 50 MCG/ACT nasal spray 2 sprays in each nostril per day.  16 g  6  . pantoprazole (PROTONIX) 40 MG tablet Take 1 tablet (40 mg total) by mouth daily.  30 tablet  1  . peg 3350 powder (MOVIPREP) 100 G SOLR Take 1 kit (100 g total) by mouth once.  1 kit  0    No Known Allergies  Family History  Problem Relation Age of Onset  . Diabetes Mother   . Hypertension Mother   . Heart disease Mother     CAD, S/P CABG  . Cancer Father 64    Prostate  . Diabetes Father   . Hypertension Father   . Diabetes Brother   . Hypertension Brother   . Diabetes Brother   . Hypertension Brother   . Hypertension Brother     History  Substance Use Topics  . Smoking status: Never Smoker   . Smokeless tobacco: Never Used  . Alcohol Use: Yes     Comment: Rare    OBJECTIVE: BP 130/84  Pulse 78  Ht 5\' 11"  (1.803 m)  Wt 232 lb 6.4 oz (105.416 kg)  BMI 32.41 kg/m2 Constitutional: Well-developed and well-nourished. No distress. HEENT: Normocephalic and atraumatic. Oropharynx is clear and moist. No oropharyngeal exudate. Conjunctivae are normal. No scleral icterus. Neck: Neck supple. Trachea midline. Cardiovascular: Normal rate, regular rhythm and intact distal pulses. No M/R/G Pulmonary/chest: Effort normal and  breath sounds normal. No wheezing, rales or rhonchi. Abdominal: Soft, mild epigastric tenderness without rebound or guarding, nondistended. Bowel sounds active throughout. There are no masses palpable. No hepatosplenomegaly. Extremities: no clubbing, cyanosis, or edema Lymphadenopathy: No cervical adenopathy noted. Neurological: Alert and oriented to person place and time. Skin: Skin is warm and dry. No rashes noted. Psychiatric: Normal mood and affect. Behavior is normal.   ASSESSMENT AND PLAN: 42 year old male with a past medical history of asthma, OSA, PUD, and GERD who seen in consultation at the request of Dr. Para March for evaluation of epigastric  pain and reflux, also reporting change in bowel habits  1.  Epigastric pain/GERD/early satiety -- given the patient's constellation of symptoms which have persisted somewhat despite daily PPI, I recommended upper endoscopy. We discussed the test and he is agreeable to proceed. I would like to obtain his prior records from Dr. Lynnae Prude, especially his prior EGDs any pathology from that test.  In the meantime I will change his Nexium to pantoprazole 40 mg daily. This changes made to try to make the medication more affordable given that this is generic.  He does describe what sounds like melena, but this may be secondary to epistaxis. EGD will help to evaluate for PUD.  2.  Change in bowel habit -- given the patient's change in bowel habits and narrow caliber stools, I recommended direct visualization with colonoscopy. We discussed this test and he is agreeable to proceed. We can perform upper endoscopy and colonoscopy on the same day.  Further recommendations after procedure

## 2012-11-10 ENCOUNTER — Telehealth: Payer: Self-pay | Admitting: *Deleted

## 2012-11-10 MED ORDER — NA SULFATE-K SULFATE-MG SULF 17.5-3.13-1.6 GM/177ML PO SOLN: 1.0000 | Freq: Once | ORAL | Status: DC

## 2012-11-10 NOTE — Telephone Encounter (Signed)
Pt informed Janell he can't afford the MOVI PREP. May I give him SUPREP Samples? Thanks.

## 2012-11-10 NOTE — Telephone Encounter (Signed)
Yes, okay for Suprep samples.  Instruction sheet will need to be changed with change in prep

## 2012-11-10 NOTE — Telephone Encounter (Signed)
Called pt. Told him I was sending in Suprep for him since moviprep was so expensive.  I also told him I will be mailing out new instructions and to call me when he receives them and we can go over them. Pt stated understanding

## 2012-12-01 ENCOUNTER — Telehealth: Payer: Self-pay | Admitting: Internal Medicine

## 2012-12-01 NOTE — Telephone Encounter (Signed)
Spoke to pt.  Told him I can leave a moviprep voucher for him at our front desk for a free prep.  Pt was very appreciative and said he will pick it up tomorrow.

## 2012-12-03 ENCOUNTER — Ambulatory Visit (AMBULATORY_SURGERY_CENTER): Payer: BC Managed Care – PPO | Admitting: Internal Medicine

## 2012-12-03 ENCOUNTER — Encounter: Payer: Self-pay | Admitting: Internal Medicine

## 2012-12-03 ENCOUNTER — Other Ambulatory Visit: Payer: Self-pay | Admitting: Internal Medicine

## 2012-12-03 VITALS — BP 123/81 | HR 73 | Temp 97.8°F | Resp 24 | Ht 71.0 in | Wt 232.0 lb

## 2012-12-03 DIAGNOSIS — D131 Benign neoplasm of stomach: Secondary | ICD-10-CM

## 2012-12-03 DIAGNOSIS — D126 Benign neoplasm of colon, unspecified: Secondary | ICD-10-CM

## 2012-12-03 DIAGNOSIS — K209 Esophagitis, unspecified: Secondary | ICD-10-CM

## 2012-12-03 DIAGNOSIS — R1013 Epigastric pain: Secondary | ICD-10-CM

## 2012-12-03 DIAGNOSIS — K635 Polyp of colon: Secondary | ICD-10-CM

## 2012-12-03 DIAGNOSIS — R198 Other specified symptoms and signs involving the digestive system and abdomen: Secondary | ICD-10-CM

## 2012-12-03 DIAGNOSIS — R194 Change in bowel habit: Secondary | ICD-10-CM

## 2012-12-03 DIAGNOSIS — K219 Gastro-esophageal reflux disease without esophagitis: Secondary | ICD-10-CM

## 2012-12-03 MED ORDER — ESOMEPRAZOLE MAGNESIUM 40 MG PO CPDR: 40.0000 mg | DELAYED_RELEASE_CAPSULE | Freq: Every day | ORAL | Status: AC

## 2012-12-03 MED ORDER — SODIUM CHLORIDE 0.9 % IV SOLN: 500.0000 mL | INTRAVENOUS | Status: DC

## 2012-12-03 NOTE — Op Note (Signed)
[facility] [address] [place], [address]   COLONOSCOPY PROCEDURE REPORT  PATIENT: [place], Pettengill  MR#: [id number] BIRTHDATE: 01/16/1971 , 42  yrs. old GENDER: Male ENDOSCOPIST: Beverley Fiedler, MD REFERRED XB:JYNWGN Duncan, M.D. PROCEDURE DATE:  12/03/2012 PROCEDURE:   Colonoscopy with snare polypectomy and Colonoscopy with cold biopsy polypectomy ASA CLASS:   Class II INDICATIONS:Change in bowel habits. MEDICATIONS: MAC sedation, administered by CRNA and Propofol (Diprivan) 140 mg IV  DESCRIPTION OF PROCEDURE:   After the risks benefits and alternatives of the procedure were thoroughly explained, informed consent was obtained.  A digital rectal exam revealed no rectal mass.   The LB [id number]  endoscope was introduced through the anus and advanced to the terminal ileum which was intubated for a short distance. No adverse events experienced.   The quality of the prep was good, using MoviPrep  The instrument was then slowly withdrawn as the colon was fully examined.   COLON FINDINGS: The mucosa appeared normal in the terminal ileum. A sessile polyp measuring 3 mm in size was found at the cecum.  A polypectomy was performed with cold forceps.  The resection was complete and the polyp tissue was completely retrieved.   Six small sessile polyps, ranging between 3-63mm in size, were found in the rectosigmoid colon.  Polypectomy was performed using cold snare. All resections were complete and all polyp tissue was completely retrieved.   Small internal hemorrhoids were found.  Retroflexed views revealed internal hemorrhoids. The time to cecum=1 minutes 03 seconds.  Withdrawal time=15 minutes 27 seconds.  The scope was withdrawn and the procedure completed. COMPLICATIONS: There were no complications.  ENDOSCOPIC IMPRESSION: 1.   Normal mucosa in the terminal ileum 2.   Sessile polyp measuring 3 mm in size was found at the cecum; polypectomy was  performed with cold forceps 3.   Six small sessile polyps, ranging between 3-29mm in size, were found in the rectosigmoid colon; Polypectomy was performed using cold snare 4.   Small internal hemorrhoids  RECOMMENDATIONS: 1.  Await pathology results 2.  Timing of repeat colonoscopy will be determined by pathology findings. 3.  You will receive a letter within 1-2 weeks with the results of your biopsy as well as final recommendations.  Please call my office if you have not received a letter after 3 weeks.   eSigned:  Beverley Fiedler, MD 12/03/2012 3:01 PM cc: Crawford Givens, MD and The Patient

## 2012-12-03 NOTE — Progress Notes (Signed)
Patient did not experience any of the following events: a burn prior to discharge; a fall within the facility; wrong site/side/patient/procedure/implant event; or a hospital transfer or hospital admission upon discharge from the facility. (G8907) Patient did not have preoperative order for IV antibiotic SSI prophylaxis. (G8918)  

## 2012-12-03 NOTE — Progress Notes (Signed)
1459 a/ox3 pleased with Mac report to [employer]

## 2012-12-03 NOTE — Op Note (Signed)
[facility] [address] [place], [address]   ENDOSCOPY PROCEDURE REPORT  PATIENT: Garrett Hinton, Garrett Hinton  MR#: [id number] BIRTHDATE: 1971-09-23 , 42  yrs. old GENDER: Male ENDOSCOPIST: Beverley Fiedler, MD REFERRED BY:  Crawford Givens PROCEDURE DATE:  12/03/2012 PROCEDURE:  EGD w/ biopsy ASA CLASS:     Class II INDICATIONS:  Epigastric pain.   Heartburn. MEDICATIONS: MAC sedation, administered by CRNA, Propofol (Diprivan), Propofol (Diprivan) 310 mg IV, and Robinul 0.2 mg IV TOPICAL ANESTHETIC: none  DESCRIPTION OF PROCEDURE: After the risks benefits and alternatives of the procedure were thoroughly explained, informed consent was obtained.  [facility] [id number] [id number] endoscope was introduced through the mouth and advanced to the second portion of the duodenum. Without limitations.  The instrument was slowly withdrawn as the mucosa was fully examined.     ESOPHAGUS: A non-obstructing and partial Schatzki ring was found 39 cm from the incisors and was widely open.  Multiple biopsies were performed.  Sample sent for histology.   The esophagus was otherwise normal.   A small hiatal hernia was noted.  STOMACH: There was mild antral gastropathy noted.  Cold forcep biopsies were taken at the antrum and angularis.   The stomach otherwise appeared normal.  DUODENUM: The duodenal mucosa showed no abnormalities in the bulb and second portion of the duodenum.  Retroflexed views revealed a hiatal hernia.     The scope was then withdrawn from the patient and the procedure completed.  COMPLICATIONS: There were no complications.  ENDOSCOPIC IMPRESSION: 1.   Non-obstructing Schatzki's ring was found 39 cm from the incisors; multiple biopsies 2.   The esophagus was otherwise normal. 3.   Small hiatal hernia 4.   There was mild antral gastropathy noted; biopsies taken to exclude H. pylori 5.   The stomach otherwise appeared normal 6.   The duodenal mucosa showed no  abnormalities in the bulb and second portion of the duodenum  RECOMMENDATIONS: 1.  Await pathology results 2.  Continue current medications 3.  Follow-up of helicobacter pylori status, treat if indicated  eSigned:  Beverley Fiedler, MD 12/03/2012 2:57 PM   CC:The Patient and Crawford Givens, MD

## 2012-12-03 NOTE — Patient Instructions (Addendum)

## 2012-12-04 ENCOUNTER — Telehealth: Payer: Self-pay | Admitting: *Deleted

## 2012-12-04 ENCOUNTER — Telehealth: Payer: Self-pay | Admitting: Family Medicine

## 2012-12-04 MED ORDER — VALACYCLOVIR HCL 500 MG PO TABS: 500.0000 mg | ORAL_TABLET | Freq: Two times a day (BID) | ORAL | Status: AC

## 2012-12-04 NOTE — Telephone Encounter (Signed)
Patient Information:  Caller Name: Moroni  Phone: [phone]  Patient: Garrett Hinton, Garrett Hinton  Gender: Male  DOB: 03-Jun-1971  Age: 42 Years  PCP: Crawford Givens Clelia Croft) [facility])  Office Follow Up:  Does the office need to follow up with this patient?: Yes  Instructions For The Office: Please review information, Wanting Rx called in for Herpes.  RN Note:  Just had colonoscopy and endoscopy exams yesterday 2/5, says unable to come to office because of cost.  Just wanting Rx called into [facility] in [place] or [facility] in [place].  Symptoms  Reason For Call & Symptoms: Tingling, sensitive, swelling, hot in genital area started last night - thinks these symptoms are prior to herpes outbreak in area. Was seen in office in 06/2012, told to call office if outbreak and medication can be called in since herpes test was positive.  Reviewed Health History In EMR: Yes  Reviewed Medications In EMR: Yes  Reviewed Allergies In EMR: Yes  Reviewed Surgeries / Procedures: Yes  Date of Onset of Symptoms: 12/03/2012  Guideline(s) Used:  Rash or Redness - Localized  Penis and Scrotum Symptoms  Disposition Per Guideline:   Go to Office Now  Reason For Disposition Reached:   Looks infected (e.g., draining sore, spreading redness)  Advice Given:  N/A  Patient Refused Recommendation:  Patient Requests Prescription  Seen in office in Sept 2013 and had labs with positive herpes results.  Told to call ofice if outbreak and Rx could be called in.

## 2012-12-04 NOTE — Telephone Encounter (Signed)
  Follow up Call-  Call back number 12/03/2012  Post procedure Call Back phone  # [phone]  Permission to leave phone message Yes     Patient questions:  Do you have a fever, pain , or abdominal swelling? no Pain Score  0 *  Have you tolerated food without any problems? yes  Have you been able to return to your normal activities? yes  Do you have any questions about your discharge instructions: Diet   no Medications  no Follow up visit  no  Do you have questions or concerns about your Care? no  Actions: * If pain score is 4 or above: No action needed, pain <4.   Pt. Stated that he had not had BM since procedure yesterday.  He has been eating and drinking as advised yesterday and tolerates fine.  Encouraged to resume Normal diet with liberal fluid intake today.  I told him that he may not have BM for a couple of days but if this becomes problematic to call office.  He verbalized understanding.

## 2012-12-04 NOTE — Telephone Encounter (Signed)
Patient advised.

## 2012-12-04 NOTE — Telephone Encounter (Signed)
Sent. Thanks.   

## 2012-12-09 ENCOUNTER — Encounter: Payer: Self-pay | Admitting: Internal Medicine

## 2013-05-12 ENCOUNTER — Emergency Department: Payer: Self-pay | Admitting: Emergency Medicine

## 2013-05-12 LAB — CBC
HCT: 42.7 % (ref 40.0–52.0)
HGB: 14.2 g/dL (ref 13.0–18.0)
MCH: 29.1 pg (ref 26.0–34.0)
MCHC: 33.2 g/dL (ref 32.0–36.0)
RDW: 14.4 % (ref 11.5–14.5)
WBC: 7 10*3/uL (ref 3.8–10.6)

## 2013-05-12 LAB — CK TOTAL AND CKMB (NOT AT ARMC)
CK, Total: 439 U/L — ABNORMAL HIGH (ref 35–232)
CK-MB: 2.9 ng/mL (ref 0.5–3.6)

## 2013-05-12 LAB — BASIC METABOLIC PANEL
Anion Gap: 4 — ABNORMAL LOW (ref 7–16)
BUN: 17 mg/dL (ref 7–18)
Chloride: 108 mmol/L — ABNORMAL HIGH (ref 98–107)
Co2: 28 mmol/L (ref 21–32)
Creatinine: 1.18 mg/dL (ref 0.60–1.30)
EGFR (African American): >60
Glucose: 121 mg/dL — ABNORMAL HIGH (ref 65–99)
Osmolality: 282 (ref 275–301)
Potassium: 3.8 mmol/L (ref 3.5–5.1)

## 2013-05-12 LAB — TROPONIN I: Troponin-I: <0.02 ng/mL

## 2013-05-13 LAB — TROPONIN I: Troponin-I: <0.02 ng/mL

## 2013-09-08 ENCOUNTER — Ambulatory Visit: Payer: Self-pay | Admitting: Family Medicine

## 2014-08-05 ENCOUNTER — Emergency Department: Payer: Self-pay | Admitting: Emergency Medicine

## 2014-08-05 LAB — CBC WITH DIFFERENTIAL/PLATELET
Basophil #: 0.1 10*3/uL (ref 0.0–0.1)
Basophil %: 1.6 %
Eosinophil #: 0.1 10*3/uL (ref 0.0–0.7)
Eosinophil %: 1.8 %
HCT: 47.4 % (ref 40.0–52.0)
HGB: 15.3 g/dL (ref 13.0–18.0)
LYMPHS ABS: 2.9 10*3/uL (ref 1.0–3.6)
Lymphocyte %: 42.6 %
MCH: 28.6 pg (ref 26.0–34.0)
MCHC: 32.2 g/dL (ref 32.0–36.0)
MCV: 89 fL (ref 80–100)
Monocyte #: 0.6 x10 3/mm (ref 0.2–1.0)
Monocyte %: 8 %
NEUTROS PCT: 46 %
Neutrophil #: 3.2 10*3/uL (ref 1.4–6.5)
Platelet: 339 10*3/uL (ref 150–440)
RBC: 5.34 10*6/uL (ref 4.40–5.90)
RDW: 14.5 % (ref 11.5–14.5)
WBC: 6.9 10*3/uL (ref 3.8–10.6)

## 2014-08-05 LAB — URINALYSIS, COMPLETE
Bacteria: NONE SEEN
Bilirubin,UR: NEGATIVE
Blood: NEGATIVE
Glucose,UR: NEGATIVE mg/dL (ref 0–75)
Hyaline Cast: 5
Ketone: NEGATIVE
Leukocyte Esterase: NEGATIVE
Nitrite: NEGATIVE
Ph: 6 (ref 4.5–8.0)
Protein: NEGATIVE
Specific Gravity: 1.024 (ref 1.003–1.030)
Squamous Epithelial: NONE SEEN
WBC UR: 3 /HPF (ref 0–5)

## 2014-08-05 LAB — BASIC METABOLIC PANEL
Anion Gap: 8 (ref 7–16)
BUN: 20 mg/dL — ABNORMAL HIGH (ref 7–18)
CO2: 26 mmol/L (ref 21–32)
CREATININE: 1.02 mg/dL (ref 0.60–1.30)
Calcium, Total: 8.9 mg/dL (ref 8.5–10.1)
Chloride: 106 mmol/L (ref 98–107)
EGFR (African American): >60
EGFR (Non-African Amer.): >60
Glucose: 100 mg/dL — ABNORMAL HIGH (ref 65–99)
OSMOLALITY: 282 (ref 275–301)
POTASSIUM: 5.3 mmol/L — AB (ref 3.5–5.1)
Sodium: 140 mmol/L (ref 136–145)

## 2014-08-05 LAB — TROPONIN I: Troponin-I: <0.02 ng/mL

## 2014-08-06 LAB — CK TOTAL AND CKMB (NOT AT ARMC)
CK, Total: 362 U/L — ABNORMAL HIGH
CK-MB: 3.5 ng/mL (ref 0.5–3.6)

## 2014-08-06 LAB — TROPONIN I: Troponin-I: <0.02 ng/mL

## 2014-09-06 ENCOUNTER — Ambulatory Visit: Payer: Self-pay | Admitting: Neurology

## 2015-08-22 IMAGING — CT CT ABD-PELV W/ CM
2 of 5 series · 17 of 46 positions shown, 19 images · IV contrast (isovue)
Comparison: Abdominal ultrasound 05/13/2013

CLINICAL DATA: Right upper quadrant abdominal pain. Initial
encounter.

EXAM:
CT ABDOMEN AND PELVIS WITH CONTRAST
TECHNIQUE: Multidetector CT imaging of the abdomen and pelvis was performed
using the standard protocol following bolus administration of
intravenous contrast.
CONTRAST:  100 mL Isovue 370

[Series 2: routine abd pel with · axial · 0.73mm/px · z∈[-610,-194]mm · 14 of 95 slices shown, 16 images]
[im 6/95  soft-tissue]
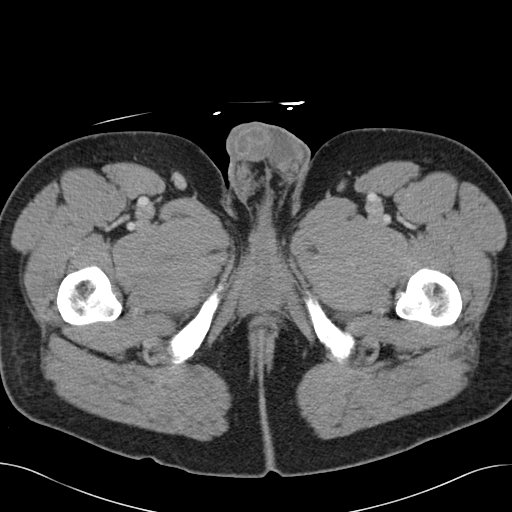
[im 6/95  bone]
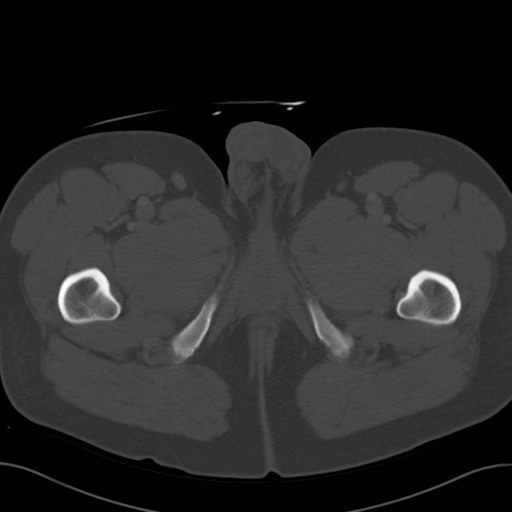
[im 11/95  soft-tissue]
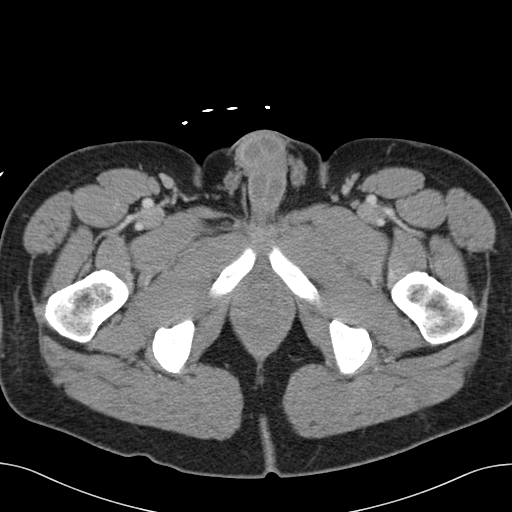
[im 21/95  soft-tissue]
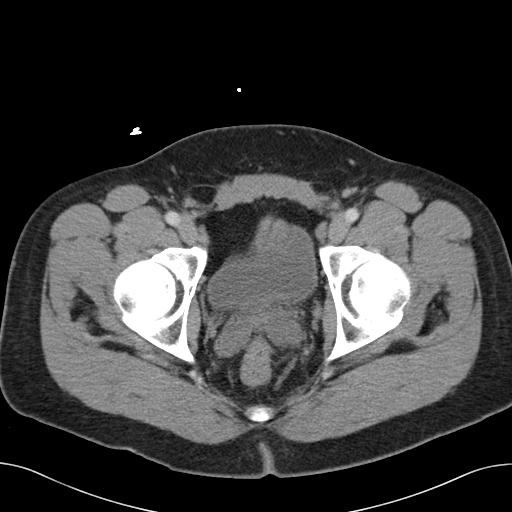
[im 27/95  soft-tissue]
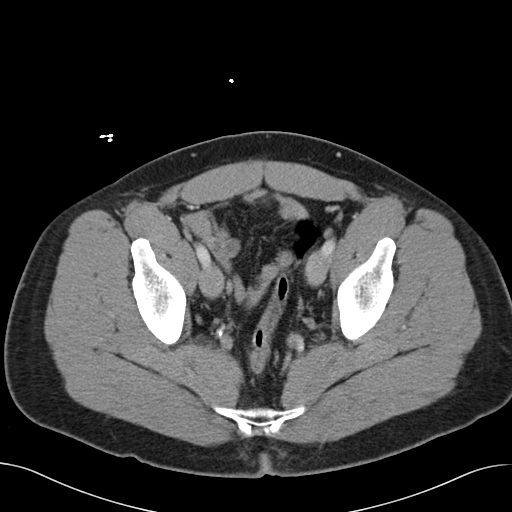
[im 32/95  soft-tissue]
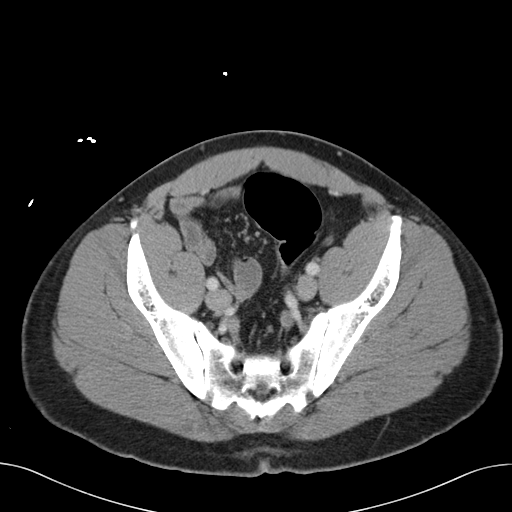
[im 37/95  soft-tissue]
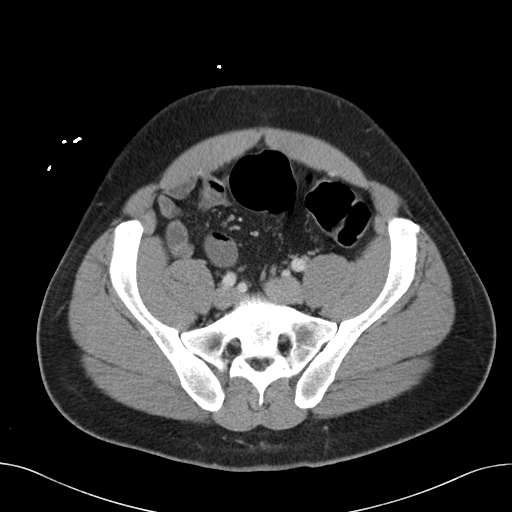
[im 42/95  soft-tissue]
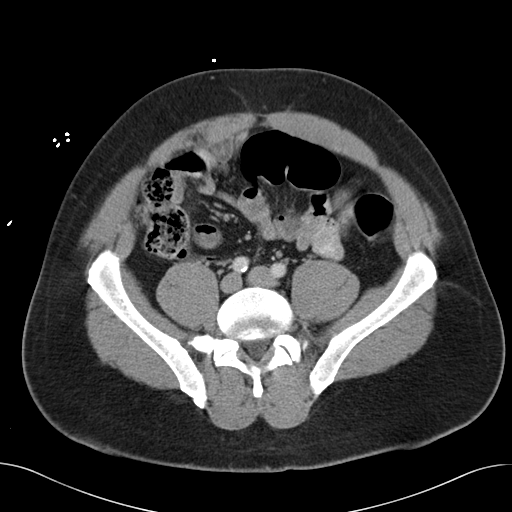
[im 53/95  soft-tissue]
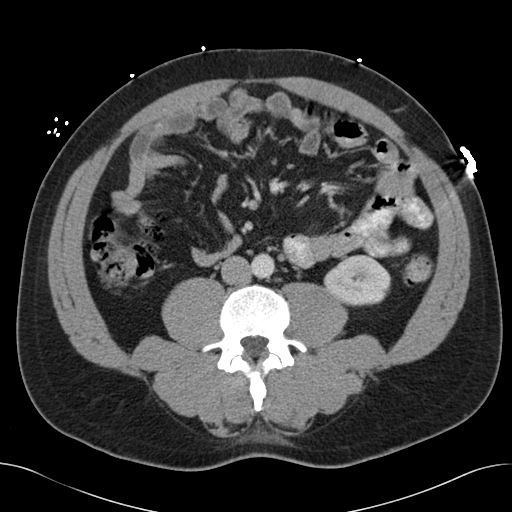
[im 58/95  soft-tissue]
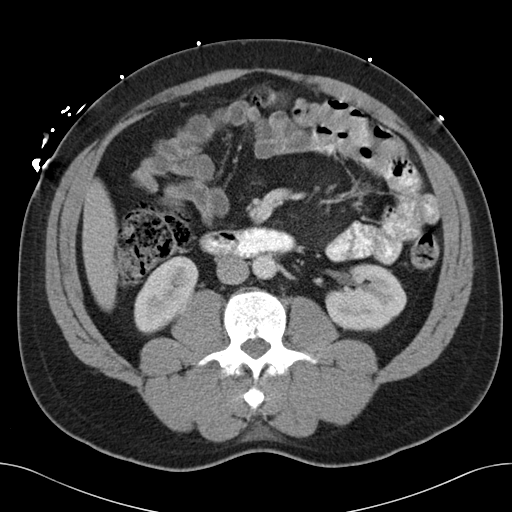
[im 58/95  bone]
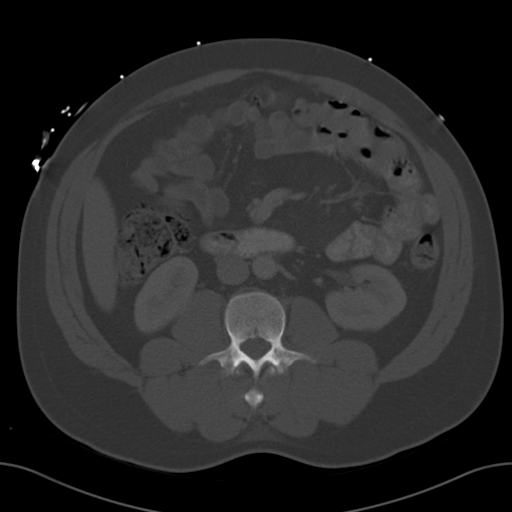
[im 63/95  soft-tissue]
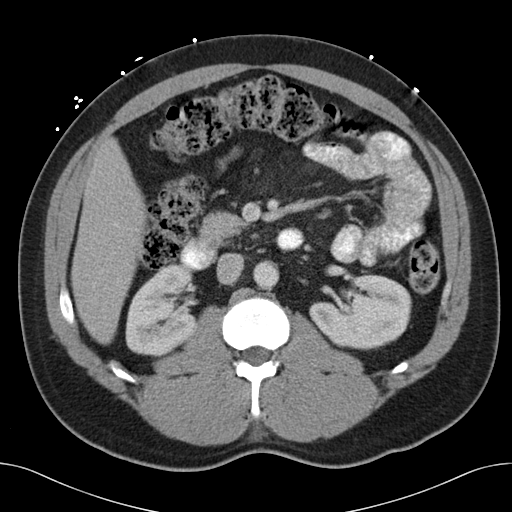
[im 68/95  soft-tissue]
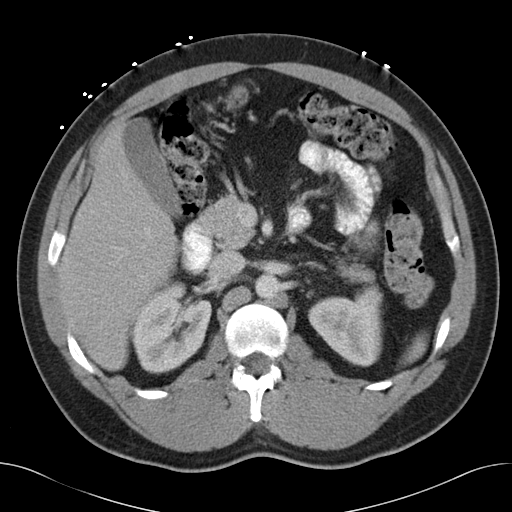
[im 74/95  soft-tissue]
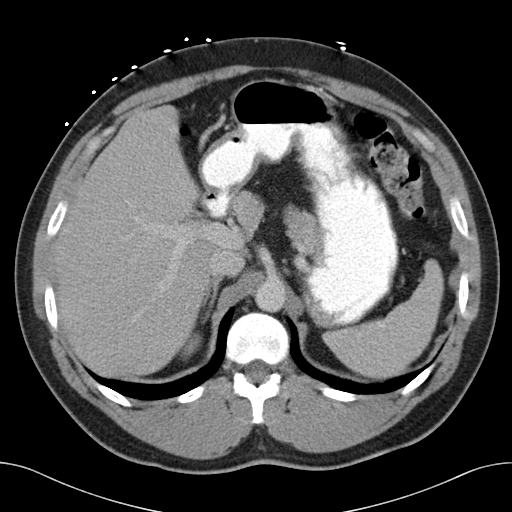
[im 84/95  soft-tissue]
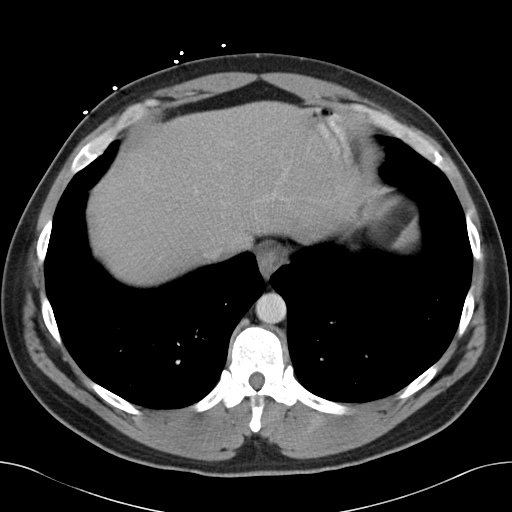
[im 89/95  soft-tissue]
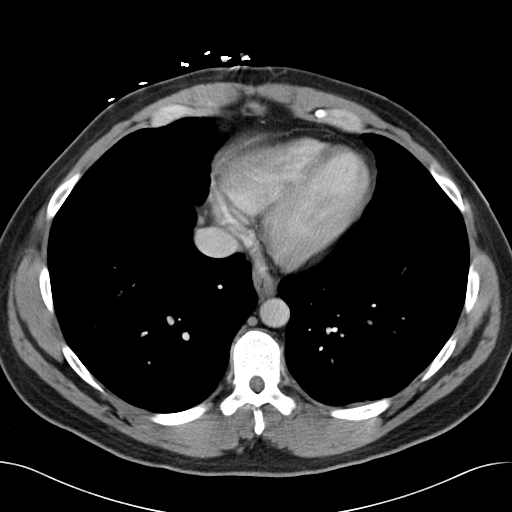

[Series 5: cor routine abd pel with · coronal · 0.93mm/px · 3 of 160 slices shown]
[im 54/160  soft-tissue]
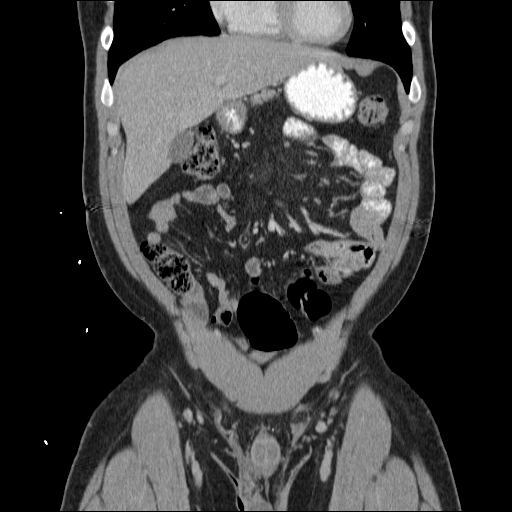
[im 71/160  soft-tissue]
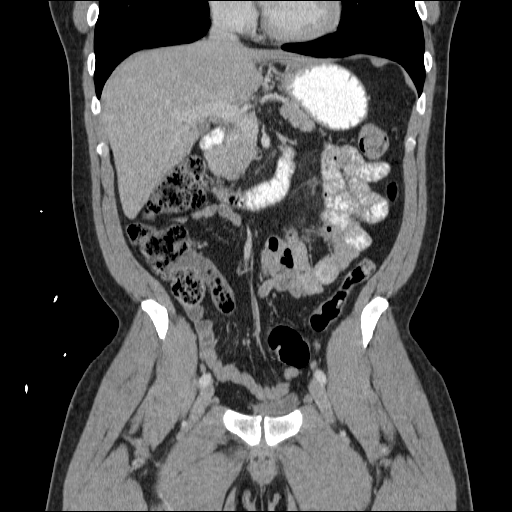
[im 89/160  soft-tissue]
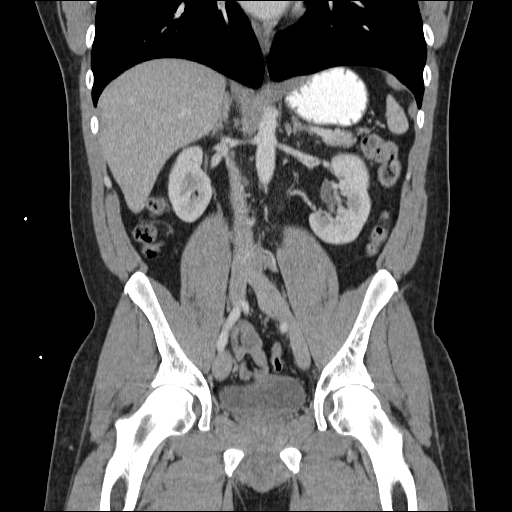

[17 of 46 positions shown; findings below may reference images not displayed]

FINDINGS: The lung bases are clear without focal nodule, mass, or airspace
disease. The heart size is normal. No significant pleural or
pericardial effusion is present.

The liver and spleen are within normal limits. The stomach,
duodenum, and pancreas are within normal limits. Common bile duct
and gallbladder are normal. The adrenal glands are normal
bilaterally. The kidneys and ureters are within normal limits. The
urinary bladder is unremarkable.

The rectosigmoid colon is within normal limits. Remainder the colon
is unremarkable. The appendix is visualized and normal. There is
some stranding in the proximal small bowel mesentery with small
lymph nodes. No focal mass or obstruction is evident. There is no
significant free fluid.

The bone windows are unremarkable.
IMPRESSION: 1. Mild stranding and small lymph nodes within the proximal small
bowel mesenteries compatible with nonspecific inflammatory change.
No discrete mass or bowel obstruction is evident.
2. No other acute or focal lesion to explain the patient's symptoms.

## 2016-06-08 ENCOUNTER — Ambulatory Visit: Payer: Self-pay | Admitting: Urology

## 2016-07-20 ENCOUNTER — Ambulatory Visit: Payer: Self-pay | Admitting: Urology

## 2016-08-23 ENCOUNTER — Ambulatory Visit: Payer: Commercial Managed Care - HMO | Attending: Neurology

## 2016-08-23 DIAGNOSIS — Z79899 Other long term (current) drug therapy: Secondary | ICD-10-CM | POA: Diagnosis not present

## 2016-08-23 DIAGNOSIS — G4733 Obstructive sleep apnea (adult) (pediatric): Secondary | ICD-10-CM | POA: Insufficient documentation

## 2016-08-23 DIAGNOSIS — R0683 Snoring: Secondary | ICD-10-CM | POA: Diagnosis present

## 2018-01-16 ENCOUNTER — Emergency Department
Admission: EM | Admit: 2018-01-16 | Discharge: 2018-01-16 | Disposition: A | Discharge status: Home / Self Care | Payer: Self-pay | Attending: Emergency Medicine | Admitting: Emergency Medicine

## 2018-01-16 ENCOUNTER — Other Ambulatory Visit: Payer: Self-pay

## 2018-01-16 ENCOUNTER — Encounter: Payer: Self-pay | Admitting: Emergency Medicine

## 2018-01-16 ENCOUNTER — Emergency Department: Payer: Self-pay

## 2018-01-16 DIAGNOSIS — Z5321 Procedure and treatment not carried out due to patient leaving prior to being seen by health care provider: Secondary | ICD-10-CM | POA: Insufficient documentation

## 2018-01-16 DIAGNOSIS — R079 Chest pain, unspecified: Secondary | ICD-10-CM | POA: Insufficient documentation

## 2018-01-16 LAB — BASIC METABOLIC PANEL
Anion gap: 8 (ref 5–15)
BUN: 14 mg/dL (ref 6–20)
CO2: 24 mmol/L (ref 22–32)
CREATININE: 1.11 mg/dL (ref 0.61–1.24)
Calcium: 8.9 mg/dL (ref 8.9–10.3)
Chloride: 107 mmol/L (ref 101–111)
GFR calc non Af Amer: >60 mL/min (ref >60)
Glucose, Bld: 106 mg/dL — ABNORMAL HIGH (ref 65–99)
POTASSIUM: 4.2 mmol/L (ref 3.5–5.1)
SODIUM: 139 mmol/L (ref 135–145)

## 2018-01-16 LAB — CBC
HCT: 43.5 % (ref 40.0–52.0)
Hemoglobin: 14.3 g/dL (ref 13.0–18.0)
MCH: 29.1 pg (ref 26.0–34.0)
MCHC: 32.9 g/dL (ref 32.0–36.0)
MCV: 88.5 fL (ref 80.0–100.0)
PLATELETS: 310 10*3/uL (ref 150–440)
RBC: 4.92 MIL/uL (ref 4.40–5.90)
RDW: 14.6 % — ABNORMAL HIGH (ref 11.5–14.5)
WBC: 5.1 10*3/uL (ref 3.8–10.6)

## 2018-01-16 LAB — TROPONIN I: Troponin I: <0.03 ng/mL (ref <0.03)

## 2018-01-16 NOTE — ED Notes (Signed)
Called once.

## 2018-01-16 NOTE — ED Triage Notes (Signed)
Pt here with c/o midsternal cp that began 2 days ago while driving, and intermittently since. States he thought it was a muscle pull or heart burn, however here to be sure. Appears in NAD.

## 2018-01-16 NOTE — ED Notes (Signed)
3rd attempt to call from lobby, no response

## 2018-01-16 NOTE — ED Triage Notes (Signed)
Pt can recreate the pain with certain movements.

## 2018-01-16 NOTE — ED Notes (Signed)
2nd attempt to call pt from lobby no response

## 2019-02-02 IMAGING — CR DG CHEST 2V
1 series · 2 of 2 positions shown · non-contrast
Comparison: 05/12/2013.

CLINICAL DATA: Central and right chest pain for the past 2 days.
Right hand and shoulder pain.

EXAM:
CHEST - 2 VIEW

[Series 1: dg chest 2 view · 0.14mm/px · 2 of 2 slices shown]
[im 1/2]
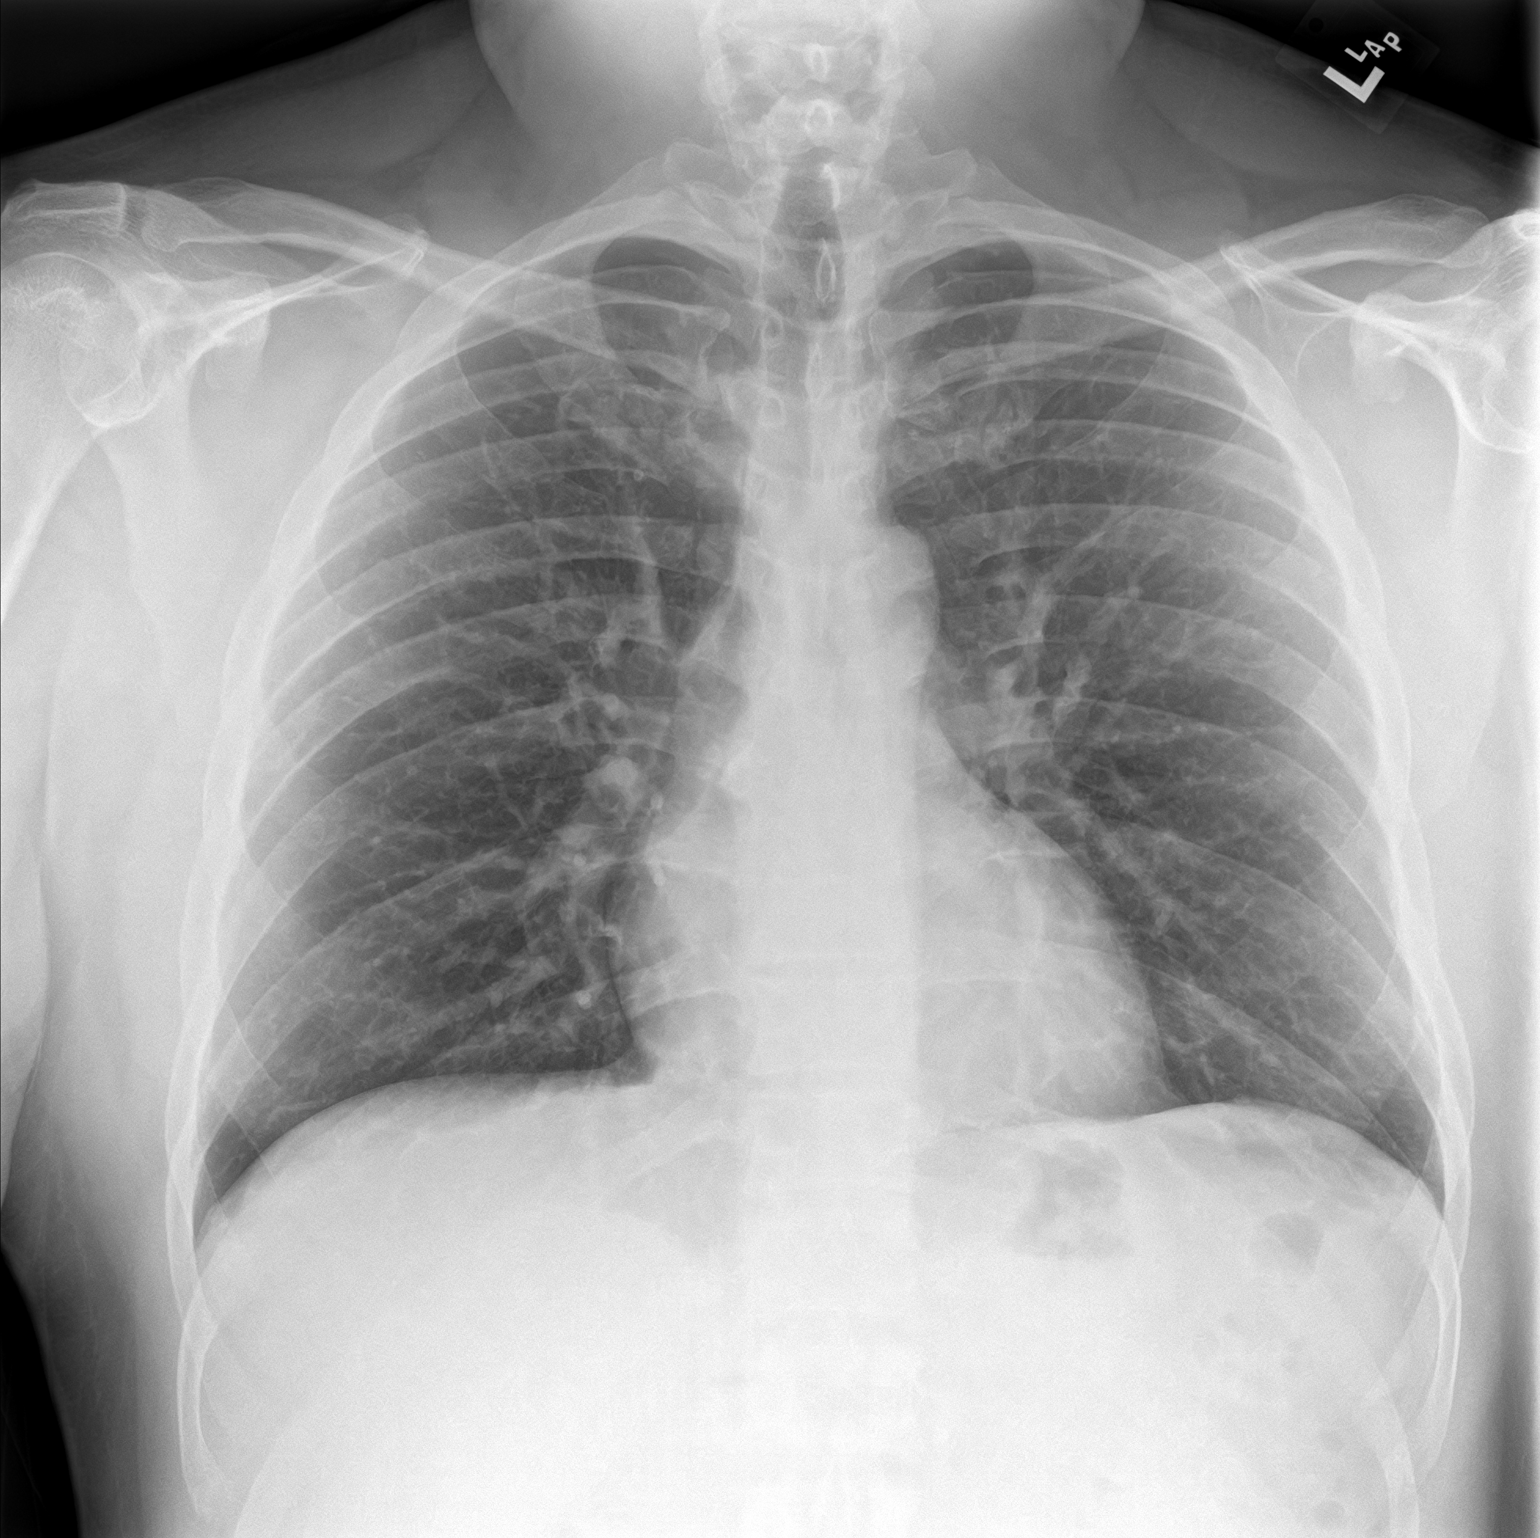
[im 2/2]
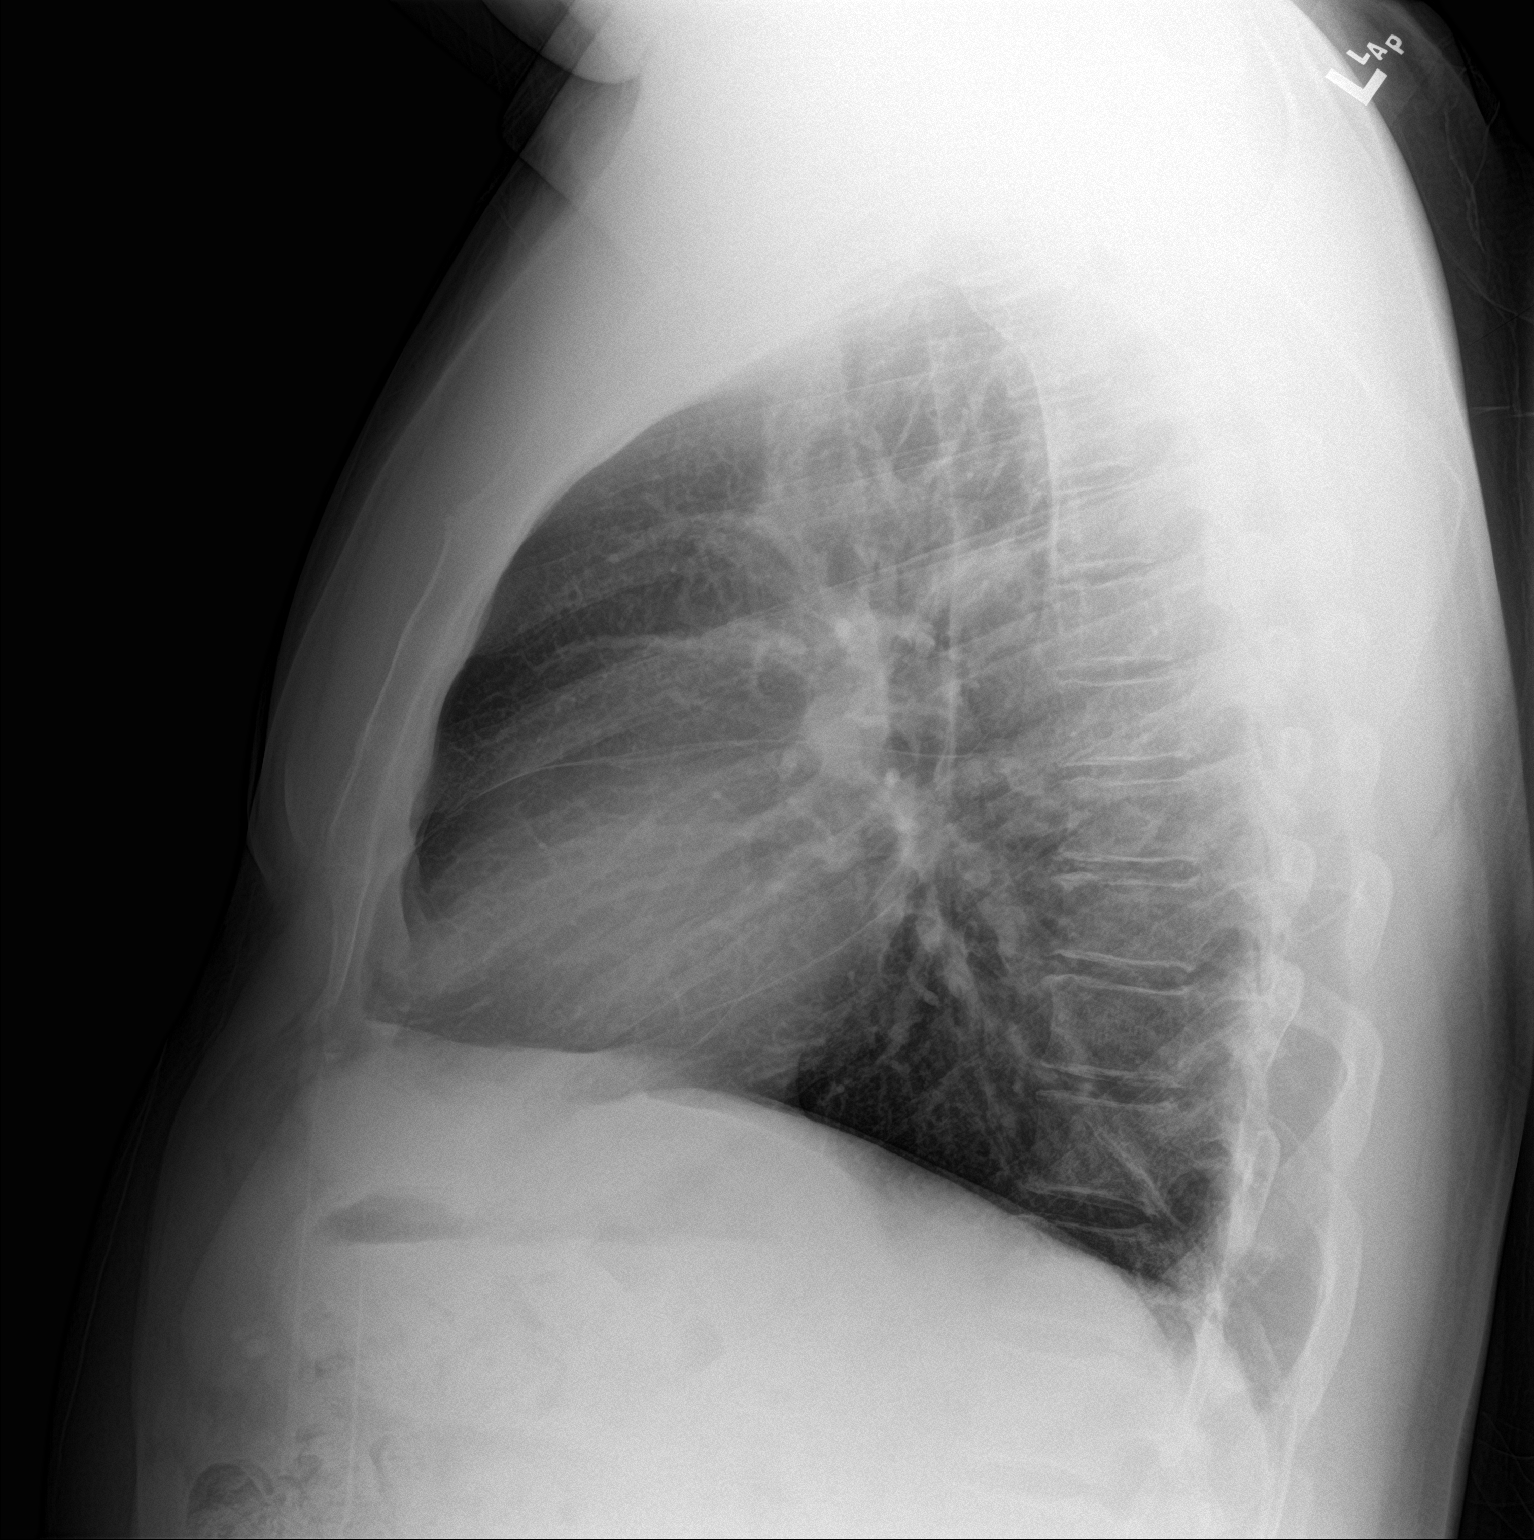

[2 of 2 positions shown; findings below may reference images not displayed]

FINDINGS: Normal sized heart. The pulmonary vasculature remains prominent.
Clear lungs. Mild thoracic spine degenerative changes.
IMPRESSION: No acute abnormality.  Stable pulmonary vascular congestion.

## 2020-12-14 ENCOUNTER — Ambulatory Visit (INDEPENDENT_AMBULATORY_CARE_PROVIDER_SITE_OTHER): Payer: BLUE CROSS/BLUE SHIELD | Admitting: Urology

## 2020-12-14 ENCOUNTER — Other Ambulatory Visit: Payer: Self-pay

## 2020-12-14 ENCOUNTER — Encounter: Payer: Self-pay | Admitting: Urology

## 2020-12-14 VITALS — BP 167/82 | HR 94 | Ht 71.0 in | Wt 240.0 lb

## 2020-12-14 DIAGNOSIS — N5203 Combined arterial insufficiency and corporo-venous occlusive erectile dysfunction: Secondary | ICD-10-CM | POA: Diagnosis not present

## 2020-12-14 DIAGNOSIS — R350 Frequency of micturition: Secondary | ICD-10-CM

## 2020-12-14 DIAGNOSIS — N50812 Left testicular pain: Secondary | ICD-10-CM | POA: Diagnosis not present

## 2020-12-14 LAB — URINALYSIS, COMPLETE
Bilirubin, UA: NEGATIVE
Glucose, UA: NEGATIVE
Ketones, UA: NEGATIVE
Leukocytes,UA: NEGATIVE
Nitrite, UA: NEGATIVE
Protein,UA: NEGATIVE
RBC, UA: NEGATIVE
Specific Gravity, UA: 1.02 (ref 1.005–1.030)
Urobilinogen, Ur: 0.2 mg/dL (ref 0.2–1.0)
pH, UA: 6.5 (ref 5.0–7.5)

## 2020-12-14 LAB — MICROSCOPIC EXAMINATION: Bacteria, UA: NONE SEEN

## 2020-12-14 MED ORDER — MELOXICAM 7.5 MG PO TABS: 7.5000 mg | ORAL_TABLET | Freq: Every day | ORAL | 0 refills | Status: AC

## 2020-12-14 MED ORDER — TADALAFIL 20 MG PO TABS: ORAL_TABLET | ORAL | 0 refills | Status: AC

## 2020-12-14 NOTE — Progress Notes (Signed)
12/14/2020 12:27 PM   Garrett Hinton Aug 26, 1971 [id number]  Referring provider: [name], MD Garrett Hinton,  Garrett Hinton [id number]  Chief Complaint  Patient presents with  . Testicle Pain    HPI: 50 year old male who presents today for further evaluation of testicular pain.  Notably, he was recently seen and evaluated on routine visit by his primary care physician on 11/24/2018 with ongoing symptoms of urinary frequency along with new onset testicular pain.  Notably at the time, his blood sugars were poorly controlled and he large amount of glucose in his urine otherwise it was unremarkable.  His most recent hemoglobin A1c was 10.8.  He reports today that he has been having left testicular discomfort for the last 2 to 3 months.  This comes and goes.  It happens a few days per week.  It can happen at any time.  No clear provoking symptoms.  Sometimes alleviated with position change.  Is also improved with NSAIDs.  He describes it is essentially dull and achy like having been kicked in the scrotum.  He denies any other associated inguinal or abdominal pain.  He reports that he has similar episode back in 2014.  Ultimately, this resolved after a few months and did not recur until recently.   He underwent scrotal ultrasound in 2014 for left testicular pain at the time noted left greater than right varicocele otherwise unremarkable.  He had a tiny left epididymal head cyst versus spermatocele.  Notably, his testicular discomfort coincides with his onset of urinary symptoms.  Since adjusting his intake and regular blood sugar control, his urinary symptoms have subsided.  His testicular discomfort persist.  He is also concerned today about erectile dysfunction.  He reports increasing difficulty maintaining and achieving erections.  This is a new problem.  He is never previously tried any medications or interventions for this.  He does have multiple risk factors for  erectile dysfunction.  He does have a personal history of hypogonadism and is being managed by his primary care physician for this.    PMH: Past Medical History:  Diagnosis Date  . Allergy   . Anxiety   . Asthma   . Gastric ulcer   . GERD (gastroesophageal reflux disease)   . OSA on CPAP   . Sleep apnea     Surgical History: Past Surgical History:  Procedure Laterality Date  . NASAL SINUS SURGERY      Home Medications:  Allergies as of 12/14/2020   No Known Allergies     Medication List       Accurate as of December 14, 2020 12:27 PM. If you have any questions, ask your nurse or doctor.        albuterol 108 (90 Base) MCG/ACT inhaler Commonly known as: ProAir HFA Inhale 2 puffs into the lungs every 4 (four) hours as needed.   budesonide-formoterol 80-4.5 MCG/ACT inhaler Commonly known as: SYMBICORT Inhale 2 puffs into the lungs 2 (two) times daily.   esomeprazole 40 MG capsule Commonly known as: NexIUM Take 1 capsule (40 mg total) by mouth daily before breakfast.   fluticasone 50 MCG/ACT nasal spray Commonly known as: FLONASE 2 sprays in each nostril per day.   glipiZIDE 5 MG 24 hr tablet Commonly known as: GLUCOTROL XL Take by mouth.   meloxicam 7.5 MG tablet Commonly known as: Mobic Take 1 tablet (7.5 mg total) by mouth daily. Started by: Hollice Espy, MD   phentermine 37.5 MG tablet Commonly known  as: ADIPEX-P Take 37.5 mg by mouth at bedtime.   tadalafil 20 MG tablet Commonly known as: CIALIS Take 3-5 tablets 1 hour prior to intercourse Started by: Hollice Espy, MD   Testosterone 20.25 MG/1.25GM (1.62%) Gel Apply topically.   valACYclovir 500 MG tablet Commonly known as: Valtrex Take 1 tablet (500 mg total) by mouth 2 (two) times daily. For 3 days.  Disp 2 courses of treatment (12 pills)       Allergies: No Known Allergies  Family History: Family History  Problem Relation Age of Onset  . Diabetes Mother   . Hypertension  Mother   . Heart disease Mother        CAD, S/P CABG  . Cancer Father 24       Prostate  . Diabetes Father   . Hypertension Father   . Diabetes Brother   . Hypertension Brother   . Diabetes Brother   . Hypertension Brother   . Hypertension Brother     Social History:  reports that he has never smoked. He has never used smokeless tobacco. He reports current alcohol use. He reports that he does not use drugs.   Physical Exam: BP (!) 167/82   Pulse 94   Ht 5\' 11"  (1.803 m)   Wt 240 lb (108.9 kg)   BMI 33.47 kg/m   Constitutional:  Alert and oriented, No acute distress. HEENT: [place] AT, moist mucus membranes.  Trachea midline, no masses. Cardiovascular: No clubbing, cyanosis, or edema. Respiratory: Normal respiratory effort, no increased work of breathing. GI: Abdomen is soft, nontender, nondistended, no abdominal masses. Obese. GU: Normal phallus with orthotopic meatus.  Bilateral descended testicles, no masses, small left epididymal head cyst appreciated, less than 1 cm, mildly tender.  No appreciable varicocele bilaterally. Skin: No rashes, bruises or suspicious lesions. Neurologic: Grossly intact, no focal deficits, moving all 4 extremities. Psychiatric: Normal mood and affect.  Laboratory Data: PSA 0.35 on 09/2019 Most recent testosterone of 213.9 10/2020  Urinalysis UA today negative  Assessment & Plan:    1. Pain in left testicle Suspect chemical epididymitis/irritation; minimal concern for bacterial infection based on his symptoms as well as a fairly benign exam today  Onset coincides with poorly controlled blood sugars in exacerbation of urinary symptoms which are now improving  Exam today was reassuring, small epididymal cyst on the left but unlikely to contributing to his symptoms, stable since ultrasound in 2014, probably incidental  Recommended supportive care with tighter fitting underwear and NSAIDs for the next month or so.  He was prescribed meloxicam 7.5 mg  and precautions were reviewed.  Return if symptoms fail to improve - Urinalysis, Complete  2. Urinary frequency Resolving  3. Combined arterial insufficiency and corporo-venous occlusive erectile dysfunction We discussed the pathophysiology of erectile dysfunction today along with possible contributing factors. Discussed possible treatment options including PDE 5 inhibitors, vacuum erectile device, intracavernosal injection, MUSE, and penile prosthesis for refractory cases.  In terms of PDE 5 inhibitors, we discussed contraindications for this medication as well as common side effects. Patient was counseled on optimal use. All of his questions were answered in detail.   Supplied with coupon and directed to least expensive options  F/u prn  Hollice Espy, MD  [facility] [address], [place], [place] [address] [phone]  I spent 45 total minutes on the day of the encounter including pre-visit review of the medical record, face-to-face time with the patient, and post visit ordering of labs/imaging/tests.

## 2021-01-10 ENCOUNTER — Other Ambulatory Visit: Payer: Self-pay | Admitting: Urology

## 2021-06-12 ENCOUNTER — Telehealth: Payer: Self-pay

## 2021-06-12 NOTE — Telephone Encounter (Signed)
Patient left a message requesting a refill for a medication but did not state which mediation. Attempted to return call, no answer left message
